# Patient Record
Sex: Female | Born: 1995 | Race: White | Hispanic: No | Marital: Married | Smoking: Current every day smoker
Health system: Southern US, Community
[De-identification: ages and names within clinical notes are randomized; demographics above are authoritative.]

## PROBLEM LIST (undated history)

## (undated) DIAGNOSIS — N809 Endometriosis, unspecified: Secondary | ICD-10-CM

## (undated) DIAGNOSIS — R51 Headache: Secondary | ICD-10-CM

## (undated) DIAGNOSIS — R519 Headache, unspecified: Secondary | ICD-10-CM

## (undated) HISTORY — PX: TYMPANOSTOMY TUBE PLACEMENT: SHX32

---
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
## 2014-02-21 ENCOUNTER — Encounter: Payer: Self-pay | Admitting: Obstetrics and Gynecology

## 2014-07-17 NOTE — H&P (Signed)
L&D Evaluation:  History:  HPI 19 yo G3P0020 [facility] 6745[facility]d [facility] presenting for decreased fetal movement.  Patient seen earlier this week on 06/15/2012 with reactive NST at that time.  She has done kick counts and gotten around 7/hr but this is still below her baseline.  She denies CTX, LOF, VB.   Presents with decreased fetal movement   Patient's Medical History No Chronic Illness   Patient's Surgical History none   Medications Pre Natal Vitamins   Allergies NKDA   Social History none   Family History Non-Contributory   ROS:  ROS All systems were reviewed.  HEENT, CNS, GI, GU, Respiratory, CV, Renal and Musculoskeletal systems were found to be normal.   Exam:  Vital Signs stable   Urine Protein not completed   General no apparent distress   Mental Status clear   Abdomen gravid, non-tender   Estimated Fetal Weight Average for gestational age   Fetal Position vtx on US   Back no CVAT   Edema no edema   FHT normal rate with no decels, category I reactive by <32 week criteria   Ucx absent   Other Viable IUP, vtx, anterior placenta, subjectively normal fluid   Impression:  Impression decreased fetal movement   Plan:  Plan EFM/NST   Comments 1) Reacitve NST with over an hour of monitoring.  The patient is able to correlate some of the onscreen movement seen but not all especially thos into anterior placenta 2) Discharge home has follow up this coming thursday with myself in [place]   Follow Up Appointment already scheduled   Electronic Signatures: Lorrene ReidStaebler, Jadyn Brasher M (MD)  (Signed 11-Apr-14 16:26)  Authored: L&D Evaluation   Last Updated: 11-Apr-14 16:26 by Lorrene ReidStaebler, Erickson Yamashiro M (MD)

## 2014-07-17 NOTE — H&P (Signed)
L&D Evaluation:  History Expanded:  HPI 19 yo G3P0020 with EDD of 09/19/2012 per 8 week US. Pt was seen today at office, membranes were stripped and pt subsequently noted fluid running down her leg. Exam at office was equivocal for rupture. +FM, some bleeding after check today. Pt reports feeling occasional contractions. [facility].   Maternal Varicella Non-Immune   Rubella Results (Maternal) immune   Presents with leaking fluid   Patient's Medical History No Chronic Illness   Patient's Surgical History none   Medications Pre Natal Vitamins   Allergies NKDA   Social History none   Family History Non-Contributory   ROS:  ROS see HPI   Exam:  Vital Signs stable   Urine Protein not completed   General no apparent distress   Mental Status clear   Abdomen gravid, non-tender   Estimated Fetal Weight Average for gestational age   Fetal Position vertex   Back no CVAT   Edema no edema   Pelvic 1/75/-2   FHT normal rate with no decels, category I, baseline 135 mod variability, + accel   Ucx irregular   Other Fern, nitrizine & pooling negative, pt rechecked after 1 hour of resting in bed, tests were negative again.   Impression:  Impression Intact membranes   Plan:  Plan discharge   Comments Discussed possible causes of fluid noted such as urinary incontinence. I feel reassured that membranes are intact as several exams failed to show +fern and I was able to palpate membranes on exam. Labor precautions reviewed.   Electronic Signatures: Vella KohlerBrothers, Brixon Zhen K (CNM)  (Signed 07-Jul-14 17:51)  Authored: L&D Evaluation   Last Updated: 07-Jul-14 17:51 by Vella KohlerBrothers, Karna Abed K (CNM)

## 2014-07-17 NOTE — H&P (Signed)
L&D Evaluation:  History:  HPI 19 yo G3P0020 with EDD of 09/19/2012 per 8 week US presents at 4739 2/[redacted] weeks gestation with c/o "contractions all day", that became more regular between 6&7PM at which time they were q5-7 minutes. States her contractions are now making her nauseous. Has had a bloody show since membranes were stripped 2 days ago. +FM.  [facility], a negative first trimester screen, a normal anatomy scan, and anemia. LABS: GBS negative. TDAP given 08/08/2012   Presents with contractions   Patient's Medical History No Chronic Illness   Patient's Surgical History none   Medications Pre Natal Vitamins  Iron   Allergies NKDA   Social History none   Family History Non-Contributory   ROS:  ROS see HPI   Exam:  Vital Signs stable  109/56   Urine Protein not completed   General grimacing with some ctxs   Mental Status clear   Chest clear   Heart normal sinus rhythm, no murmur/gallop/rubs   Abdomen gravid, tender with contractions   Estimated Fetal Weight Average for gestational age   Fetal Position vtx   Edema 1+   Reflexes 3+   Pelvic no external lesions, 3/80%/-1   Mebranes Intact   FHT normal rate with no decels, 135 with accels to 160s to 180   FHT Description mod variability   Ucx q2-3 with some couplets, triplets   Skin dry   Impression:  Impression IUP at 39 3/7 weeks in early labor   Plan:  Plan EFM/NST, monitor contractions and for cervical change, Stadol for pain, epidural when more active   Electronic Signatures: Trinna BalloonGutierrez, Elim Peale L (CNM)  (Signed 10-Jul-14 03:23)  Authored: L&D Evaluation   Last Updated: 10-Jul-14 03:23 by Trinna BalloonGutierrez, Natalya Domzalski L (CNM)

## 2014-08-27 ENCOUNTER — Encounter: Payer: Self-pay | Admitting: *Deleted

## 2014-08-27 ENCOUNTER — Encounter
Admission: RE | Admit: 2014-08-27 | Discharge: 2014-08-27 | Disposition: A | Discharge status: Home / Self Care | Payer: 59 | Source: Ambulatory Visit | Attending: Obstetrics and Gynecology | Admitting: Obstetrics and Gynecology

## 2014-08-27 DIAGNOSIS — Z975 Presence of (intrauterine) contraceptive device: Secondary | ICD-10-CM | POA: Diagnosis not present

## 2014-08-27 DIAGNOSIS — N809 Endometriosis, unspecified: Secondary | ICD-10-CM | POA: Diagnosis not present

## 2014-08-27 DIAGNOSIS — Z800 Family history of malignant neoplasm of digestive organs: Secondary | ICD-10-CM | POA: Diagnosis not present

## 2014-08-27 DIAGNOSIS — Z801 Family history of malignant neoplasm of trachea, bronchus and lung: Secondary | ICD-10-CM | POA: Diagnosis not present

## 2014-08-27 DIAGNOSIS — R102 Pelvic and perineal pain: Secondary | ICD-10-CM | POA: Diagnosis present

## 2014-08-27 DIAGNOSIS — G8929 Other chronic pain: Secondary | ICD-10-CM | POA: Diagnosis present

## 2014-08-27 LAB — COMPREHENSIVE METABOLIC PANEL
ALBUMIN: 3.9 g/dL (ref 3.5–5.0)
ALK PHOS: 80 U/L (ref 38–126)
ALT: 14 U/L (ref 14–54)
ANION GAP: 6 (ref 5–15)
AST: 16 U/L (ref 15–41)
BILIRUBIN TOTAL: 0.3 mg/dL (ref 0.3–1.2)
BUN: 13 mg/dL (ref 6–20)
CO2: 26 mmol/L (ref 22–32)
CREATININE: 0.67 mg/dL (ref 0.44–1.00)
Calcium: 9 mg/dL (ref 8.9–10.3)
Chloride: 106 mmol/L (ref 101–111)
GFR calc non Af Amer: >60 mL/min (ref >60)
Glucose, Bld: 89 mg/dL (ref 65–99)
Potassium: 4.1 mmol/L (ref 3.5–5.1)
Sodium: 138 mmol/L (ref 135–145)
TOTAL PROTEIN: 6.9 g/dL (ref 6.5–8.1)

## 2014-08-27 LAB — ABO/RH: ABO/RH(D): B POS

## 2014-08-27 LAB — TYPE AND SCREEN
ABO/RH(D): B POS
ANTIBODY SCREEN: NEGATIVE

## 2014-08-27 LAB — CBC
HEMATOCRIT: 41 % (ref 35.0–47.0)
Hemoglobin: 14.1 g/dL (ref 12.0–16.0)
MCH: 29.6 pg (ref 26.0–34.0)
MCHC: 34.3 g/dL (ref 32.0–36.0)
MCV: 86.1 fL (ref 80.0–100.0)
Platelets: 265 10*3/uL (ref 150–440)
RBC: 4.76 MIL/uL (ref 3.80–5.20)
RDW: 12.7 % (ref 11.5–14.5)
WBC: 9.5 10*3/uL (ref 3.6–11.0)

## 2014-08-27 LAB — PREGNANCY, URINE: Preg Test, Ur: NEGATIVE

## 2014-08-27 NOTE — Patient Instructions (Signed)
  Your procedure is scheduled on:6/23/16To find out your arrival time please call [phone] between 1PM - 3PM on 08/28/14.  Remember: Instructions that are not followed completely may result in serious medical risk, up to and including death, or upon the discretion of your surgeon and anesthesiologist your surgery may need to be rescheduled.    __x__ 1. Do not eat food or drink liquids after midnight. No gum chewing or hard candies.     __x__ 2. No Alcohol for 24 hours before or after surgery.   ____ 3. Bring all medications with you on the day of surgery if instructed.    _x___ 4. Notify your doctor if there is any change in your medical condition     (cold, fever, infections).     Do not wear jewelry, make-up, hairpins, clips or nail polish.  Do not wear lotions, powders, or perfumes. You may wear deodorant.  Do not shave 48 hours prior to surgery. Men may shave face and neck.  Do not bring valuables to the hospital.    [facility] is not responsible for any belongings or valuables.               Contacts, dentures or bridgework may not be worn into surgery.  Leave your suitcase in the car. After surgery it may be brought to your room.  For patients admitted to the hospital, discharge time is determined by your                treatment team.   Patients discharged the day of surgery will not be allowed to drive home.   Please read over the following fact sheets that you were given:   Surgical Site Infection Prevention   ____ Take these medicines the morning of surgery with A SIP OF WATER:    1.   2.   3.   4.  5.  6.  ____ Fleet Enema (as directed)   __x__ Use CHG Soap as directed  ____ Use inhalers on the day of surgery  ____ Stop metformin 2 days prior to surgery    ____ Take 1/2 of usual insulin dose the night before surgery and none on the morning of surgery.   ____ Stop Coumadin/Plavix/aspirin on   ____ Stop Anti-inflammatories on    ____ Stop supplements  until after surgery.    ____ Bring C-Pap to the hospital.

## 2014-08-30 ENCOUNTER — Ambulatory Visit: Payer: 59 | Admitting: Certified Registered Nurse Anesthetist

## 2014-08-30 ENCOUNTER — Encounter: Payer: Self-pay | Admitting: *Deleted

## 2014-08-30 ENCOUNTER — Ambulatory Visit
Admission: RE | Admit: 2014-08-30 | Discharge: 2014-08-30 | Disposition: A | Discharge status: Home / Self Care | Payer: 59 | Source: Ambulatory Visit | Attending: Obstetrics and Gynecology | Admitting: Obstetrics and Gynecology

## 2014-08-30 ENCOUNTER — Encounter
Admission: RE | Disposition: A | Discharge status: Home / Self Care | Payer: Self-pay | Source: Ambulatory Visit | Attending: Obstetrics and Gynecology

## 2014-08-30 DIAGNOSIS — Z975 Presence of (intrauterine) contraceptive device: Secondary | ICD-10-CM | POA: Insufficient documentation

## 2014-08-30 DIAGNOSIS — N809 Endometriosis, unspecified: Secondary | ICD-10-CM | POA: Diagnosis not present

## 2014-08-30 DIAGNOSIS — G8929 Other chronic pain: Secondary | ICD-10-CM

## 2014-08-30 DIAGNOSIS — Z801 Family history of malignant neoplasm of trachea, bronchus and lung: Secondary | ICD-10-CM | POA: Insufficient documentation

## 2014-08-30 DIAGNOSIS — Z800 Family history of malignant neoplasm of digestive organs: Secondary | ICD-10-CM | POA: Insufficient documentation

## 2014-08-30 DIAGNOSIS — R102 Pelvic and perineal pain: Secondary | ICD-10-CM | POA: Insufficient documentation

## 2014-08-30 HISTORY — DX: Headache, unspecified: R51.9

## 2014-08-30 HISTORY — PX: LAPAROSCOPY: SHX197

## 2014-08-30 HISTORY — DX: Headache: R51

## 2014-08-30 LAB — POCT PREGNANCY, URINE: PREG TEST UR: NEGATIVE

## 2014-08-30 SURGERY — LAPAROSCOPY, DIAGNOSTIC
Anesthesia: General | Wound class: Clean Contaminated

## 2014-08-30 MED ORDER — ONDANSETRON HCL 4 MG/2ML IJ SOLN
INTRAMUSCULAR | Status: DC | PRN
  Administered 2014-08-30: 4 mg via INTRAVENOUS

## 2014-08-30 MED ORDER — FENTANYL CITRATE (PF) 100 MCG/2ML IJ SOLN
INTRAMUSCULAR | Status: DC | PRN
  Administered 2014-08-30: 100 ug via INTRAVENOUS
  Administered 2014-08-30: 50 ug via INTRAVENOUS

## 2014-08-30 MED ORDER — GLYCOPYRROLATE 0.2 MG/ML IJ SOLN
INTRAMUSCULAR | Status: DC | PRN
  Administered 2014-08-30: 0.6 mg via INTRAVENOUS

## 2014-08-30 MED ORDER — ROCURONIUM BROMIDE 100 MG/10ML IV SOLN
INTRAVENOUS | Status: DC | PRN
  Administered 2014-08-30: 30 mg via INTRAVENOUS
  Administered 2014-08-30: 10 mg via INTRAVENOUS

## 2014-08-30 MED ORDER — KETOROLAC TROMETHAMINE 30 MG/ML IJ SOLN
INTRAMUSCULAR | Status: DC | PRN
  Administered 2014-08-30: 30 mg via INTRAVENOUS

## 2014-08-30 MED ORDER — HYDROCODONE-ACETAMINOPHEN 5-325 MG PO TABS: 1.0000 | ORAL_TABLET | Freq: Four times a day (QID) | ORAL | Status: DC | PRN

## 2014-08-30 MED ORDER — EPHEDRINE SULFATE 50 MG/ML IJ SOLN
INTRAMUSCULAR | Status: DC | PRN
  Administered 2014-08-30: 5 mg via INTRAVENOUS

## 2014-08-30 MED ORDER — DEXAMETHASONE SODIUM PHOSPHATE 4 MG/ML IJ SOLN
INTRAMUSCULAR | Status: DC | PRN
  Administered 2014-08-30: 5 mg via INTRAVENOUS

## 2014-08-30 MED ORDER — LIDOCAINE HCL (CARDIAC) 20 MG/ML IV SOLN
INTRAVENOUS | Status: DC | PRN
  Administered 2014-08-30: 80 mg via INTRAVENOUS

## 2014-08-30 MED ORDER — LACTATED RINGERS IV SOLN
INTRAVENOUS | Status: DC
  Administered 2014-08-30: 11:00:00 via INTRAVENOUS

## 2014-08-30 MED ORDER — FAMOTIDINE 20 MG PO TABS
20.0000 mg | ORAL_TABLET | Freq: Once | ORAL | Status: AC
  Administered 2014-08-30: 20 mg via ORAL

## 2014-08-30 MED ORDER — SILVER NITRATE-POT NITRATE 75-25 % EX MISC
CUTANEOUS | Status: DC | PRN
  Administered 2014-08-30 (×2): 2

## 2014-08-30 MED ORDER — IBUPROFEN 600 MG PO TABS: 600.0000 mg | ORAL_TABLET | Freq: Four times a day (QID) | ORAL | Status: DC | PRN

## 2014-08-30 MED ORDER — MIDAZOLAM HCL 2 MG/2ML IJ SOLN
INTRAMUSCULAR | Status: DC | PRN
  Administered 2014-08-30: 2 mg via INTRAVENOUS

## 2014-08-30 MED ORDER — PHENYLEPHRINE HCL 10 MG/ML IJ SOLN
INTRAMUSCULAR | Status: DC | PRN
  Administered 2014-08-30 (×2): 100 ug via INTRAVENOUS

## 2014-08-30 MED ORDER — NEOSTIGMINE METHYLSULFATE 10 MG/10ML IV SOLN
INTRAVENOUS | Status: DC | PRN
  Administered 2014-08-30: 4 mg via INTRAVENOUS

## 2014-08-30 MED ORDER — ONDANSETRON HCL 4 MG/2ML IJ SOLN
INTRAMUSCULAR | Status: AC
  Filled 2014-08-30: qty 2

## 2014-08-30 MED ORDER — BUPIVACAINE HCL (PF) 0.5 % IJ SOLN
INTRAMUSCULAR | Status: AC
  Filled 2014-08-30: qty 30

## 2014-08-30 MED ORDER — ONDANSETRON HCL 4 MG/2ML IJ SOLN
4.0000 mg | Freq: Once | INTRAMUSCULAR | Status: AC | PRN
  Administered 2014-08-30: 4 mg via INTRAVENOUS

## 2014-08-30 MED ORDER — FENTANYL CITRATE (PF) 100 MCG/2ML IJ SOLN: 25.0000 ug | INTRAMUSCULAR | Status: DC | PRN

## 2014-08-30 MED ORDER — BUPIVACAINE HCL 0.5 % IJ SOLN
INTRAMUSCULAR | Status: DC | PRN
  Administered 2014-08-30: 10 mL

## 2014-08-30 MED ORDER — PROPOFOL 10 MG/ML IV BOLUS
INTRAVENOUS | Status: DC | PRN
  Administered 2014-08-30: 150 mg via INTRAVENOUS

## 2014-08-30 SURGICAL SUPPLY — 40 items
BLADE SURG SZ11 CARB STEEL (BLADE) ×3 IMPLANT
CANISTER SUCT 1200ML W/VALVE (MISCELLANEOUS) ×3 IMPLANT
CHLORAPREP W/TINT 26ML (MISCELLANEOUS) ×3 IMPLANT
DRAPE LEGGINS SURG 28X43 STRL (DRAPES) ×3 IMPLANT
DRAPE SHEET LG 3/4 BI-LAMINATE (DRAPES) ×3 IMPLANT
DRAPE UNDER BUTTOCK W/FLU (DRAPES) ×3 IMPLANT
DRESSING TELFA 4X3 1S ST N-ADH (GAUZE/BANDAGES/DRESSINGS) ×3 IMPLANT
GLOVE BIO SURGEON STRL SZ7 (GLOVE) ×9 IMPLANT
GLOVE BIOGEL PI IND STRL 7.5 (GLOVE) ×3 IMPLANT
GLOVE BIOGEL PI INDICATOR 7.5 (GLOVE) ×6
GOWN STRL REUS W/ TWL LRG LVL3 (GOWN DISPOSABLE) ×3 IMPLANT
GOWN STRL REUS W/TWL LRG LVL3 (GOWN DISPOSABLE) ×6
IRRIGATION STRYKERFLOW (MISCELLANEOUS) IMPLANT
IRRIGATOR STRYKERFLOW (MISCELLANEOUS)
IV LACTATED RINGERS 1000ML (IV SOLUTION) ×3 IMPLANT
JELLY LUB 2OZ STRL (MISCELLANEOUS) ×2
JELLY LUBE 2OZ STRL (MISCELLANEOUS) ×1 IMPLANT
KIT RM TURNOVER CYSTO AR (KITS) ×3 IMPLANT
LABEL OR SOLS (LABEL) ×3 IMPLANT
LIQUID BAND (GAUZE/BANDAGES/DRESSINGS) ×3 IMPLANT
NDL SAFETY 22GX1.5 (NEEDLE) ×3 IMPLANT
NEEDLE HYPO 22GX1.5 SAFETY (NEEDLE) ×3 IMPLANT
NS IRRIG 500ML POUR BTL (IV SOLUTION) ×3 IMPLANT
PACK LAP CHOLECYSTECTOMY (MISCELLANEOUS) ×3 IMPLANT
PAD GROUND ADULT SPLIT (MISCELLANEOUS) ×3 IMPLANT
PAD OB MATERNITY 4.3X12.25 (PERSONAL CARE ITEMS) ×3 IMPLANT
PAD PREP 24X41 OB/GYN DISP (PERSONAL CARE ITEMS) ×3 IMPLANT
SCISSORS METZENBAUM CVD 33 (INSTRUMENTS) IMPLANT
SHEARS HARMONIC ACE PLUS 36CM (ENDOMECHANICALS) ×3 IMPLANT
SLEEVE ENDOPATH XCEL 5M (ENDOMECHANICALS) ×6 IMPLANT
SOL PREP PVP 2OZ (MISCELLANEOUS) ×3
SOLUTION PREP PVP 2OZ (MISCELLANEOUS) ×1 IMPLANT
SUT MNCRL 4-0 (SUTURE) ×2
SUT MNCRL 4-0 27XMFL (SUTURE) ×1
SUT VIC AB 2-0 UR6 27 (SUTURE) IMPLANT
SUTURE MNCRL 4-0 27XMF (SUTURE) ×1 IMPLANT
TROCAR ENDO BLADELESS 11MM (ENDOMECHANICALS) ×3 IMPLANT
TROCAR XCEL NON-BLD 5MMX100MML (ENDOMECHANICALS) ×3 IMPLANT
TROCAR XCEL UNIV SLVE 11M 100M (ENDOMECHANICALS) ×6 IMPLANT
TUBING INSUFFLATOR HI FLOW (MISCELLANEOUS) ×3 IMPLANT

## 2014-08-30 NOTE — Op Note (Signed)
Operative Report   Pre-Op Diagnosis: Chronic pelvic pain  Post-Op Diagnosis: Endometriosis  Procedures: Diagnostic laparoscopy  Primary Surgeon: Dr. Thomasene Mohair   EBL: Minimal   IVF: 800 mL   Urine output: 200 mL  Specimens: None  Drains: None  Complications: None   Disposition: PACU   Condition: Stable   Findings:  1) Normal-appearing uterus fallopian tubes and ovaries bilaterally. 2) Area concerning for endometriosis and left ovarian fossa directly overlying ureter.  Procedure Summary:  The patient was taken to the operating room where general anesthesia was administered and found to be adequate. The patient was placed in the dorsal supine lithotomy position in Moccasin stirrups and prepped and draped in the usual sterile fashion. After a timeout was called, a sterile speculum was placed the vagina and a single-tooth tenaculum was affixed to the anterior lip of the cervix. An acorn uterine manipulator was attached to the tenaculum. An indwelling catheter was placed in her bladder.  Attention was turned to the abdomen where after injection of local aesthetic, a 5 mm infraumbilical incision was made with the scalpel. Entrance to the abdomen was obtained via Optiview trocar direct visualization entry.  Verification of entry into the abdominal cavity was undertaken using opening pressures. After verification, the abdomen was insufflated with CO2. The camera was inserted through the umbilical port with the above-noted findings. Atraumatic entry was also verified. A 5 mm suprapubic port was placed under direct intra-abdominal camera visualization. Decision was made not to biopsy lesion due to location and proximity to the ureter. The appendix was partially visualized and appeared normal. The right upper quadrant was also visualized and appeared normal. This terminated the procedure. All instrumentation was removed from the abdomen and the abdomen was desufflated. All trochars were  removed. The skin was reapproximated using Dermabond. Additional local epistatic was injected into each incision site for a total of approximately 18 mL's of half percent Marcaine plain.  The acorn manipulator was removed and the tenaculum was removed from the cervix. Hemostasis was obtained using silver nitrate. The catheter was removed from her bladder. The vagina was inspected and no instruments or sponges were left behind.  Sponge, lap, needle, and instrument counts were correct x 2.  VTE prophylaxis: SCDs. Antibiotic prophylaxis: none.The patient was then awakened and taken to the recovery room in stable condition.   Conard Novak, MD, FACOG 08/30/2014 12:34 PM

## 2014-08-30 NOTE — Transfer of Care (Signed)
Immediate Anesthesia Transfer of Care Note  Patient: Dorothy Nguyen  Procedure(s) Performed: Procedure(s): LAPAROSCOPY DIAGNOSTIC (N/A)  Patient Location: PACU  Anesthesia Type:General  Level of Consciousness: awake, alert  and oriented  Airway & Oxygen Therapy: Patient Spontanous Breathing and Patient connected to face mask oxygen  Post-op Assessment: Report given to RN and Post -op Vital signs reviewed and stable  Post vital signs: Reviewed and stable  Last Vitals:  Filed Vitals:   08/30/14 1238  BP: 106/78  Pulse: 103  Temp: 36.8 C  Resp: 23    Complications: No apparent anesthesia complications

## 2014-08-30 NOTE — Anesthesia Postprocedure Evaluation (Signed)
  Anesthesia Post-op Note  Patient: Dorothy Nguyen  Procedure(s) Performed: Procedure(s): LAPAROSCOPY DIAGNOSTIC (N/A)  Anesthesia type:General  Patient location: PACU  Post pain: Pain level controlled  Post assessment: Post-op Vital signs reviewed, Patient's Cardiovascular Status Stable, Respiratory Function Stable, Patent Airway and No signs of Nausea or vomiting  Post vital signs: Reviewed and stable  Last Vitals:  Filed Vitals:   08/30/14 1330  BP: 105/56  Pulse: 70  Temp: 36.1 C  Resp: 16    Level of consciousness: awake, alert  and patient cooperative  Complications: No apparent anesthesia complications

## 2014-08-30 NOTE — Anesthesia Preprocedure Evaluation (Signed)
Anesthesia Evaluation  Patient identified by MRN, date of birth, ID band Patient awake    Reviewed: Allergy & Precautions, NPO status , Patient's Chart, lab work & pertinent test results  History of Anesthesia Complications Negative for: history of anesthetic complications  Airway Mallampati: II  TM Distance: >3 FB Neck ROM: Full    Dental  (+) Teeth Intact   Pulmonary Current Smoker (1/2 ppd),          Cardiovascular     Neuro/Psych    GI/Hepatic GERD- (1-2 x /week)  ,  Endo/Other    Renal/GU      Musculoskeletal   Abdominal   Peds  Hematology   Anesthesia Other Findings   Reproductive/Obstetrics                             Anesthesia Physical Anesthesia Plan  ASA: II  Anesthesia Plan: General   Post-op Pain Management:    Induction:   Airway Management Planned: Oral ETT  Additional Equipment:   Intra-op Plan:   Post-operative Plan:   Informed Consent: I have reviewed the patients History and Physical, chart, labs and discussed the procedure including the risks, benefits and alternatives for the proposed anesthesia with the patient or authorized representative who has indicated his/her understanding and acceptance.     Plan Discussed with:   Anesthesia Plan Comments:         Anesthesia Quick Evaluation

## 2014-08-30 NOTE — OR Nursing (Signed)
After getting dressed for dc home.  She felt a slight wave of nausea.  Explained use of aroma therapy (peppermint oil) on a cotton ball for nausea.  Safety assessment complete.

## 2014-08-30 NOTE — OR Nursing (Signed)
The aromatherapy is pleasant.  I feel better.

## 2014-08-30 NOTE — Discharge Instructions (Addendum)
AMBULATORY SURGERY  °DISCHARGE INSTRUCTIONS ° ° °1) The drugs that you were given will stay in your system until tomorrow so for the next 24 hours you should not: ° °A) Drive an automobile °B) Make any legal decisions °C) Drink any alcoholic beverage ° ° °2) You may resume regular meals tomorrow.  Today it is better to start with liquids and gradually work up to solid foods. ° °You may eat anything you prefer, but it is better to start with liquids, then soup and crackers, and gradually work up to solid foods. ° ° °3) Please notify your doctor immediately if you have any unusual bleeding, trouble breathing, redness and pain at the surgery site, drainage, fever, or pain not relieved by medication. ° ° °4) Additional Instructions: °5)  °

## 2014-08-30 NOTE — H&P (Signed)
History and Physical Interval Note:  Dorothy Nguyen  has presented today for surgery, with the diagnosis of chronic pelvic pain  The various methods of treatment have been discussed with the patient and family. After consideration of risks, benefits and other options for treatment, the patient has consented to  Procedure(s): LAPAROSCOPY DIAGNOSTIC (N/A) as a surgical intervention .  The patient's history has been reviewed, patient examined, no change in status, stable for surgery.  I have reviewed the patient's chart and labs.  Questions were answered to the patient's satisfaction.    The patient is not taking a beta blocker and none is indicated for this surgery based on the patient's risk factors.   Conard Novak, MD, FACOG 08/30/2014 11:19 AM

## 2014-08-30 NOTE — Anesthesia Procedure Notes (Signed)
Procedure Name: Intubation Date/Time: 08/30/2014 11:31 AM Performed by: Omer Jack Pre-anesthesia Checklist: Patient identified, Suction available, Emergency Drugs available, Patient being monitored and Timeout performed Patient Re-evaluated:Patient Re-evaluated prior to inductionOxygen Delivery Method: Circle system utilized Preoxygenation: Pre-oxygenation with 100% oxygen Intubation Type: IV induction Ventilation: Mask ventilation without difficulty Laryngoscope Size: Mac and 3 Grade View: Grade I Tube type: Oral Tube size: 7.0 mm Number of attempts: 1 Airway Equipment and Method: Stylet Placement Confirmation: ETT inserted through vocal cords under direct vision,  positive ETCO2 and breath sounds checked- equal and bilateral Secured at: 22 cm Tube secured with: Tape Dental Injury: Teeth and Oropharynx as per pre-operative assessment

## 2017-04-17 ENCOUNTER — Emergency Department: Payer: BLUE CROSS/BLUE SHIELD

## 2017-04-17 ENCOUNTER — Emergency Department
Admission: EM | Admit: 2017-04-17 | Discharge: 2017-04-18 | Disposition: A | Discharge status: Home / Self Care | Payer: BLUE CROSS/BLUE SHIELD | Attending: Emergency Medicine | Admitting: Emergency Medicine

## 2017-04-17 ENCOUNTER — Other Ambulatory Visit: Payer: Self-pay

## 2017-04-17 DIAGNOSIS — F17210 Nicotine dependence, cigarettes, uncomplicated: Secondary | ICD-10-CM | POA: Diagnosis not present

## 2017-04-17 DIAGNOSIS — N83291 Other ovarian cyst, right side: Secondary | ICD-10-CM | POA: Insufficient documentation

## 2017-04-17 DIAGNOSIS — R102 Pelvic and perineal pain: Secondary | ICD-10-CM | POA: Diagnosis present

## 2017-04-17 DIAGNOSIS — R52 Pain, unspecified: Secondary | ICD-10-CM

## 2017-04-17 DIAGNOSIS — N83201 Unspecified ovarian cyst, right side: Secondary | ICD-10-CM

## 2017-04-17 HISTORY — DX: Endometriosis, unspecified: N80.9

## 2017-04-17 LAB — COMPREHENSIVE METABOLIC PANEL
ALT: 12 U/L — ABNORMAL LOW (ref 14–54)
AST: 14 U/L — ABNORMAL LOW (ref 15–41)
Albumin: 4.3 g/dL (ref 3.5–5.0)
Alkaline Phosphatase: 72 U/L (ref 38–126)
Anion gap: 10 (ref 5–15)
BUN: 15 mg/dL (ref 6–20)
CO2: 27 mmol/L (ref 22–32)
Calcium: 9.3 mg/dL (ref 8.9–10.3)
Chloride: 102 mmol/L (ref 101–111)
Creatinine, Ser: 0.77 mg/dL (ref 0.44–1.00)
GFR calc Af Amer: >60 mL/min (ref >60)
GFR calc non Af Amer: >60 mL/min (ref >60)
Glucose, Bld: 91 mg/dL (ref 65–99)
Potassium: 3.5 mmol/L (ref 3.5–5.1)
SODIUM: 139 mmol/L (ref 135–145)
Total Bilirubin: 0.5 mg/dL (ref 0.3–1.2)
Total Protein: 7.3 g/dL (ref 6.5–8.1)

## 2017-04-17 LAB — POCT PREGNANCY, URINE: Preg Test, Ur: NEGATIVE

## 2017-04-17 LAB — CBC
HCT: 39.5 % (ref 35.0–47.0)
Hemoglobin: 13.6 g/dL (ref 12.0–16.0)
MCH: 28.9 pg (ref 26.0–34.0)
MCHC: 34.3 g/dL (ref 32.0–36.0)
MCV: 84.1 fL (ref 80.0–100.0)
PLATELETS: 305 10*3/uL (ref 150–440)
RBC: 4.7 MIL/uL (ref 3.80–5.20)
RDW: 13.3 % (ref 11.5–14.5)
WBC: 10.4 10*3/uL (ref 3.6–11.0)

## 2017-04-17 LAB — URINALYSIS, COMPLETE (UACMP) WITH MICROSCOPIC
Bilirubin Urine: NEGATIVE
Glucose, UA: NEGATIVE mg/dL
Hgb urine dipstick: NEGATIVE
Ketones, ur: NEGATIVE mg/dL
Leukocytes, UA: NEGATIVE
Nitrite: NEGATIVE
PH: 7 (ref 5.0–8.0)
Protein, ur: NEGATIVE mg/dL
Specific Gravity, Urine: 1.023 (ref 1.005–1.030)

## 2017-04-17 LAB — WET PREP, GENITAL
Clue Cells Wet Prep HPF POC: NONE SEEN
Sperm: NONE SEEN
Trich, Wet Prep: NONE SEEN
Yeast Wet Prep HPF POC: NONE SEEN

## 2017-04-17 LAB — LIPASE, BLOOD: Lipase: 34 U/L (ref 11–51)

## 2017-04-17 MED ORDER — FENTANYL CITRATE (PF) 100 MCG/2ML IJ SOLN
25.0000 ug | Freq: Once | INTRAMUSCULAR | Status: AC
  Administered 2017-04-17: 25 ug via INTRAVENOUS
  Filled 2017-04-17: qty 2

## 2017-04-17 MED ORDER — ONDANSETRON HCL 4 MG/2ML IJ SOLN
4.0000 mg | Freq: Once | INTRAMUSCULAR | Status: AC
  Administered 2017-04-17: 4 mg via INTRAVENOUS
  Filled 2017-04-17: qty 2

## 2017-04-17 MED ORDER — SODIUM CHLORIDE 0.9 % IV BOLUS (SEPSIS)
500.0000 mL | Freq: Once | INTRAVENOUS | Status: AC
  Administered 2017-04-17: 500 mL via INTRAVENOUS

## 2017-04-17 NOTE — ED Notes (Signed)
Pt reports RLQ pain x 1 day, reports has had similar pain in past and it was ruptured ovarian cyst.  Pt reports cloudy urine w/ strong odor, but not malodorous.

## 2017-04-17 NOTE — ED Provider Notes (Signed)
[facility] Emergency Department Provider Note   ____________________________________________   First MD Initiated Contact with Patient 04/17/17 2306     (approximate)  I have reviewed the triage vital signs and the nursing notes.   HISTORY  Chief Complaint Abdominal Pain and Back Pain    HPI Dorothy Nguyen is a 22 y.o. female who presents to the ED from home with a chief complaint of pelvic pain, back pain, dysuria and dark, cloudy urine.  Patient reports onset of lower abdominal pain this morning.  Over the course of the day pain has radiated to her right lower quadrant/pelvis, and now into bilateral lower back.  Symptoms associated with occasional nausea, dysuria and dark, cloudy urine.  Patient has a history of chronic pelvic pain and states this feels like an ovarian cyst which she has had previously.  Denies associated fever, chills, chest pain, shortness of breath, vomiting, diarrhea.  Notes vaginal discharge.  Denies vaginal bleeding.  Last sexual intercourse over 1 week ago.  Denies recent travel or trauma.   Past Medical History:  Diagnosis Date  . Endometriosis   . Headache     Patient Active Problem List   Diagnosis Date Noted  . Chronic pelvic pain in female 08/30/2014    Past Surgical History:  Procedure Laterality Date  . LAPAROSCOPY N/A 08/30/2014   Procedure: LAPAROSCOPY DIAGNOSTIC;  Surgeon: Conard Novak, MD;  Location: [facility] ORS;  Service: Gynecology;  Laterality: N/A;  . TYMPANOSTOMY TUBE PLACEMENT     2 times    Prior to Admission medications   Medication Sig Start Date End Date Taking? Authorizing Provider  HYDROcodone-acetaminophen (NORCO) 5-325 MG per tablet Take 1 tablet by mouth every 6 (six) hours as needed for moderate pain. 08/30/14   Conard Novak, MD  ibuprofen (ADVIL,MOTRIN) 600 MG tablet Take 1 tablet (600 mg total) by mouth every 6 (six) hours as needed for mild pain or cramping. 08/30/14   Conard Novak, MD    Allergies Patient has no known allergies.  History reviewed. No pertinent family history.  Social History Social History   Tobacco Use  . Smoking status: Current Every Day Smoker    Packs/day: 0.50    Types: Cigarettes  . Smokeless tobacco: Never Used  Substance Use Topics  . Alcohol use: Yes    Comment: occ  . Drug use: No    Review of Systems  Constitutional: No fever/chills. Eyes: No visual changes. ENT: No sore throat. Cardiovascular: Denies chest pain. Respiratory: Denies shortness of breath. Gastrointestinal: For pelvic pain.  No abdominal pain.  Positive for nausea, no vomiting.  No diarrhea.  No constipation. Genitourinary: Positive for vaginal discharge and dysuria. Musculoskeletal: Positive for back pain. Skin: Negative for rash. Neurological: Negative for headaches, focal weakness or numbness.   ____________________________________________   PHYSICAL EXAM:  VITAL SIGNS: ED Triage Vitals  Enc Vitals Group     BP 04/17/17 1844 110/67     Pulse Rate 04/17/17 1844 (!) 116     Resp 04/17/17 1844 18     Temp 04/17/17 1844 98 F (36.7 C)     Temp Source 04/17/17 1844 Oral     SpO2 04/17/17 1844 100 %     Weight 04/17/17 1845 152 lb (68.9 kg)     Height 04/17/17 1845 5\' 4"  (1.626 m)     Head Circumference --      Peak Flow --      Pain Score 04/17/17 1845  8     Pain Loc --      Pain Edu? --      Excl. in GC? --     Constitutional: Alert and oriented. Well appearing and in no acute distress. Eyes: Conjunctivae are normal. PERRL. EOMI. Head: Atraumatic. Nose: No congestion/rhinnorhea. Mouth/Throat: Mucous membranes are moist.  Oropharynx non-erythematous. Neck: No stridor.   Cardiovascular: Normal rate, regular rhythm. Grossly normal heart sounds.  Good peripheral circulation. Respiratory: Normal respiratory effort.  No retractions. Lungs CTAB. Gastrointestinal: Soft and mildly tender to palpation bilateral pelvis and right upper  quadrant without rebound or guarding.  No pain at McBurney's point.  No distention. No abdominal bruits. No CVA tenderness. Musculoskeletal: No lower extremity tenderness nor edema.  No joint effusions. Neurologic:  Normal speech and language. No gross focal neurologic deficits are appreciated. No gait instability. Skin:  Skin is warm, dry and intact. No rash noted. Psychiatric: Mood and affect are normal. Speech and behavior are normal.  ____________________________________________   LABS (all labs ordered are listed, but only abnormal results are displayed)  Labs Reviewed  WET PREP, GENITAL - Abnormal; Notable for the following components:      Result Value   WBC, Wet Prep HPF POC FEW (*)    All other components within normal limits  COMPREHENSIVE METABOLIC PANEL - Abnormal; Notable for the following components:   AST 14 (*)    ALT 12 (*)    All other components within normal limits  URINALYSIS, COMPLETE (UACMP) WITH MICROSCOPIC - Abnormal; Notable for the following components:   Color, Urine YELLOW (*)    APPearance TURBID (*)    Squamous Epithelial / LPF 0-5 (*)    Bacteria, UA FEW (*)    All other components within normal limits  CHLAMYDIA/NGC RT PCR ([facility] ONLY)  LIPASE, BLOOD  CBC  POC URINE PREG, ED  POCT PREGNANCY, URINE   ____________________________________________  EKG  None ____________________________________________  RADIOLOGY  ED MD interpretation: Right ovarian cyst  Official radiology report(s): US Pelvis Transvanginal Non-ob (tv Only)  Result Date: 04/18/2017 CLINICAL DATA:  Right lower quadrant pain EXAM: TRANSABDOMINAL AND TRANSVAGINAL ULTRASOUND OF PELVIS DOPPLER ULTRASOUND OF OVARIES TECHNIQUE: Both transabdominal and transvaginal ultrasound examinations of the pelvis were performed. Transabdominal technique was performed for global imaging of the pelvis including uterus, ovaries, adnexal regions, and pelvic cul-de-sac. It was necessary to  proceed with endovaginal exam following the transabdominal exam to visualize the uterus endometrium and ovaries. Color and duplex Doppler ultrasound was utilized to evaluate blood flow to the ovaries. COMPARISON:  Pelvic ultrasound 10/13/2011 FINDINGS: Uterus Measurements: 8.9 x 5 x 5.2 cm. No fibroids or other mass visualized. Endometrium Thickness: 7.1 mm.  Intrauterine device present. Right ovary Measurements: 5 x 2.7 x 3.6 cm. Heterogenous isoechoic area with internal fluid echogenicity measuring 3.2 x 2.3 x 3.2 cm. Left ovary Measurements: 3.9 x 2.2 x 2.7 cm. Normal appearance/no adnexal mass. Pulsed Doppler evaluation of both ovaries demonstrates normal low-resistance arterial and venous waveforms. Other findings No abnormal free fluid. IMPRESSION: 1. Negative for ovarian torsion 2. Heterogenous isoechoic area with internal fluid echogenicity in the right ovary measuring 3.2 cm, possibly representing an involuting cyst; suggest 6-12 week sonographic follow-up to ensure resolution. Electronically Signed   By: Jasmine Pang M.D.   On: 04/18/2017 00:53   US Pelvis Complete  Result Date: 04/18/2017 CLINICAL DATA:  Right lower quadrant pain EXAM: TRANSABDOMINAL AND TRANSVAGINAL ULTRASOUND OF PELVIS DOPPLER ULTRASOUND OF OVARIES TECHNIQUE: Both transabdominal and transvaginal  ultrasound examinations of the pelvis were performed. Transabdominal technique was performed for global imaging of the pelvis including uterus, ovaries, adnexal regions, and pelvic cul-de-sac. It was necessary to proceed with endovaginal exam following the transabdominal exam to visualize the uterus endometrium and ovaries. Color and duplex Doppler ultrasound was utilized to evaluate blood flow to the ovaries. COMPARISON:  Pelvic ultrasound 10/13/2011 FINDINGS: Uterus Measurements: 8.9 x 5 x 5.2 cm. No fibroids or other mass visualized. Endometrium Thickness: 7.1 mm.  Intrauterine device present. Right ovary Measurements: 5 x 2.7 x 3.6 cm.  Heterogenous isoechoic area with internal fluid echogenicity measuring 3.2 x 2.3 x 3.2 cm. Left ovary Measurements: 3.9 x 2.2 x 2.7 cm. Normal appearance/no adnexal mass. Pulsed Doppler evaluation of both ovaries demonstrates normal low-resistance arterial and venous waveforms. Other findings No abnormal free fluid. IMPRESSION: 1. Negative for ovarian torsion 2. Heterogenous isoechoic area with internal fluid echogenicity in the right ovary measuring 3.2 cm, possibly representing an involuting cyst; suggest 6-12 week sonographic follow-up to ensure resolution. Electronically Signed   By: Jasmine PangKim  Fujinaga M.D.   On: 04/18/2017 00:53   [place] Pelvic Doppler (torsion R/o Or Mass Arterial Flow)  Result Date: 04/18/2017 CLINICAL DATA:  Right lower quadrant pain EXAM: TRANSABDOMINAL AND TRANSVAGINAL ULTRASOUND OF PELVIS DOPPLER ULTRASOUND OF OVARIES TECHNIQUE: Both transabdominal and transvaginal ultrasound examinations of the pelvis were performed. Transabdominal technique was performed for global imaging of the pelvis including uterus, ovaries, adnexal regions, and pelvic cul-de-sac. It was necessary to proceed with endovaginal exam following the transabdominal exam to visualize the uterus endometrium and ovaries. Color and duplex Doppler ultrasound was utilized to evaluate blood flow to the ovaries. COMPARISON:  Pelvic ultrasound 10/13/2011 FINDINGS: Uterus Measurements: 8.9 x 5 x 5.2 cm. No fibroids or other mass visualized. Endometrium Thickness: 7.1 mm.  Intrauterine device present. Right ovary Measurements: 5 x 2.7 x 3.6 cm. Heterogenous isoechoic area with internal fluid echogenicity measuring 3.2 x 2.3 x 3.2 cm. Left ovary Measurements: 3.9 x 2.2 x 2.7 cm. Normal appearance/no adnexal mass. Pulsed Doppler evaluation of both ovaries demonstrates normal low-resistance arterial and venous waveforms. Other findings No abnormal free fluid. IMPRESSION: 1. Negative for ovarian torsion 2. Heterogenous isoechoic area with  internal fluid echogenicity in the right ovary measuring 3.2 cm, possibly representing an involuting cyst; suggest 6-12 week sonographic follow-up to ensure resolution. Electronically Signed   By: Jasmine PangKim  Fujinaga M.D.   On: 04/18/2017 00:53    ____________________________________________   PROCEDURES  Procedure(s) performed:   Pelvic exam: External exam WNL without rashes, lesions or vesicles.  Speculum exam reveals scant white discharge which is not malodorous.  Bimanual exam with mild cervical tenderness.  Cervical os is not erythematous.  Procedures  Critical Care performed: No  ____________________________________________   INITIAL IMPRESSION / ASSESSMENT AND PLAN / ED COURSE  As part of my medical decision making, I reviewed the following data within the electronic MEDICAL RECORD NUMBER History obtained from family, Nursing notes reviewed and incorporated, Labs reviewed, Old chart reviewed, Radiograph reviewed  and Notes from prior ED visits.   22 year old female with chronic pelvic pain, endometriosis, history of ovarian cysts who presents with pelvic pain, back pain, dysuria and dark cloudy urine. Differential diagnosis includes, but is not limited to, ovarian cyst, ovarian torsion, acute appendicitis, diverticulitis, urinary tract infection/pyelonephritis, endometriosis, bowel obstruction, colitis, renal colic, gastroenteritis, hernia, fibroids, endometriosis, pregnancy related pain including ectopic pregnancy, etc.  Laboratory and urinalysis results unremarkable.  Will administer analgesia with antiemetic, and proceed  to pelvic ultrasound.  Clinical Course as of Apr 19 139  Wynelle Link Apr 18, 2017  0140 Patient resting in no acute distress.  Pain improved.  Updated patient and family member of ultrasound results.  Will discharge home with analgesics and patient will follow-up with her gynecologist next week.  Strict return precautions given.  Both verbalize understanding and agree with  plan of care.  [JS]    Clinical Course User Index [JS] Irean Hong, MD     ____________________________________________   FINAL CLINICAL IMPRESSION(S) / ED DIAGNOSES  Final diagnoses:  Pain  Pelvic pain in female  Cyst of right ovary     ED Discharge Orders    None       Note:  This document was prepared using Dragon voice recognition software and may include unintentional dictation errors.    Irean Hong, MD 04/18/17 [phone]

## 2017-04-17 NOTE — ED Notes (Signed)
Pt taken to US

## 2017-04-17 NOTE — ED Triage Notes (Signed)
Pt arrived via POV c/o low abdominal pain with some radiation to the RLQ, now having back pain, some dysuria and dark, cloudy urine

## 2017-04-18 LAB — CHLAMYDIA/NGC RT PCR (ARMC ONLY)
Chlamydia Tr: NOT DETECTED
N GONORRHOEAE: NOT DETECTED

## 2017-04-18 MED ORDER — OXYCODONE-ACETAMINOPHEN 5-325 MG PO TABS: 1.0000 | ORAL_TABLET | ORAL | 0 refills | Status: DC | PRN

## 2017-04-18 MED ORDER — IBUPROFEN 600 MG PO TABS: 600.0000 mg | ORAL_TABLET | Freq: Three times a day (TID) | ORAL | 0 refills | Status: DC | PRN

## 2017-04-18 MED ORDER — ONDANSETRON 4 MG PO TBDP: 4.0000 mg | ORAL_TABLET | Freq: Three times a day (TID) | ORAL | 0 refills | Status: DC | PRN

## 2017-04-18 NOTE — Discharge Instructions (Signed)
1.  You may take pain medicines as needed (Motrin/Percocet). °2.  Return to the ER for worsening symptoms, persistent vomiting, difficulty breathing or other concerns. °

## 2018-02-21 ENCOUNTER — Other Ambulatory Visit: Payer: Self-pay

## 2018-02-21 ENCOUNTER — Encounter: Payer: Self-pay | Admitting: Emergency Medicine

## 2018-02-21 ENCOUNTER — Ambulatory Visit
Admission: EM | Admit: 2018-02-21 | Discharge: 2018-02-21 | Disposition: A | Discharge status: Home / Self Care | Payer: BLUE CROSS/BLUE SHIELD | Attending: Family Medicine | Admitting: Family Medicine

## 2018-02-21 DIAGNOSIS — H6503 Acute serous otitis media, bilateral: Secondary | ICD-10-CM | POA: Insufficient documentation

## 2018-02-21 MED ORDER — AMOXICILLIN 875 MG PO TABS: 875.0000 mg | ORAL_TABLET | Freq: Two times a day (BID) | ORAL | 0 refills | Status: DC

## 2018-02-21 MED ORDER — FLUCONAZOLE 150 MG PO TABS: 150.0000 mg | ORAL_TABLET | Freq: Every day | ORAL | 1 refills | Status: DC

## 2018-02-21 NOTE — ED Triage Notes (Signed)
Pt c/o pain in the back of her ears and hearing is "muffled" she is also congested. Started about 3-4 days ago.

## 2018-02-21 NOTE — Discharge Instructions (Signed)
Over the counter flonase Sudafed

## 2018-02-21 NOTE — ED Provider Notes (Signed)
[facility]    CSN: [id number] Arrival date & time: 02/21/18  1334     History   Chief Complaint Chief Complaint  Patient presents with  . Otalgia    appt bilateral     HPI Dorothy Nguyen is a 22 y.o. female.   The history is provided by the patient.  Otalgia  Location:  Bilateral Behind ear:  No abnormality Quality:  Aching Severity:  Moderate Onset quality:  Sudden Duration:  3 days Timing:  Constant Progression:  Unchanged Chronicity:  New Context: recent URI   Context: not direct blow and not elevation change   Relieved by:  None tried Ineffective treatments:  None tried Associated symptoms: congestion   Associated symptoms: no abdominal pain, no cough, no diarrhea, no ear discharge, no fever, no headaches, no hearing loss, no neck pain, no rash, no rhinorrhea, no sore throat, no tinnitus and no vomiting     Past Medical History:  Diagnosis Date  . Endometriosis   . Headache     Patient Active Problem List   Diagnosis Date Noted  . Chronic pelvic pain in female 08/30/2014    Past Surgical History:  Procedure Laterality Date  . LAPAROSCOPY N/A 08/30/2014   Procedure: LAPAROSCOPY DIAGNOSTIC;  Surgeon: Conard Novak, MD;  Location: [facility] ORS;  Service: Gynecology;  Laterality: N/A;  . TYMPANOSTOMY TUBE PLACEMENT     2 times    OB History   No obstetric history on file.      Home Medications    Prior to Admission medications   Medication Sig Start Date End Date Taking? Authorizing Provider  amoxicillin (AMOXIL) 875 MG tablet Take 1 tablet (875 mg total) by mouth 2 (two) times daily. 02/21/18   Payton Mccallum, MD  fluconazole (DIFLUCAN) 150 MG tablet Take 1 tablet (150 mg total) by mouth daily. 02/21/18   Payton Mccallum, MD  HYDROcodone-acetaminophen (NORCO) 5-325 MG per tablet Take 1 tablet by mouth every 6 (six) hours as needed for moderate pain. 08/30/14   Conard Novak, MD  ibuprofen (ADVIL,MOTRIN) 600 MG tablet Take 1  tablet (600 mg total) by mouth every 8 (eight) hours as needed. 04/18/17   Irean Hong, MD  ondansetron (ZOFRAN ODT) 4 MG disintegrating tablet Take 1 tablet (4 mg total) by mouth every 8 (eight) hours as needed for nausea or vomiting. 04/18/17   Irean Hong, MD  oxyCODONE-acetaminophen (PERCOCET/ROXICET) 5-325 MG tablet Take 1 tablet by mouth every 4 (four) hours as needed for severe pain. 04/18/17   Irean Hong, MD    Family History Family History  Problem Relation Age of Onset  . Healthy Mother   . Healthy Father     Social History Social History   Tobacco Use  . Smoking status: Current Every Day Smoker    Packs/day: 0.25    Types: Cigarettes  . Smokeless tobacco: Never Used  Substance Use Topics  . Alcohol use: Yes    Comment: occ  . Drug use: No     Allergies   Patient has no known allergies.   Review of Systems Review of Systems  Constitutional: Negative for fever.  HENT: Positive for congestion and ear pain. Negative for ear discharge, hearing loss, rhinorrhea, sore throat and tinnitus.   Respiratory: Negative for cough.   Gastrointestinal: Negative for abdominal pain, diarrhea and vomiting.  Musculoskeletal: Negative for neck pain.  Skin: Negative for rash.  Neurological: Negative for headaches.     Physical Exam  Triage Vital Signs ED Triage Vitals  Enc Vitals Group     BP 02/21/18 1354 107/68     Pulse Rate 02/21/18 1354 88     Resp 02/21/18 1354 18     Temp 02/21/18 1354 98.1 F (36.7 C)     Temp Source 02/21/18 1354 Oral     SpO2 02/21/18 1354 100 %     Weight 02/21/18 1350 170 lb (77.1 kg)     Height 02/21/18 1350 5\' 5"  (1.651 m)     Head Circumference --      Peak Flow --      Pain Score 02/21/18 1349 4     Pain Loc --      Pain Edu? --      Excl. in GC? --    No data found.  Updated Vital Signs BP 107/68 (BP Location: Left Arm)   Pulse 88   Temp 98.1 F (36.7 C) (Oral)   Resp 18   Ht 5\' 5"  (1.651 m)   Wt 77.1 kg   LMP 01/31/2018    SpO2 100%   BMI 28.29 kg/m   Visual Acuity Right Eye Distance:   Left Eye Distance:   Bilateral Distance:    Right Eye Near:   Left Eye Near:    Bilateral Near:     Physical Exam Vitals signs and nursing note reviewed.  Constitutional:      General: She is not in acute distress.    Appearance: She is well-developed. She is not diaphoretic.  HENT:     Head: Normocephalic and atraumatic.     Right Ear: Ear canal and external ear normal. A middle ear effusion is present. Tympanic membrane is erythematous and bulging.     Left Ear: Ear canal and external ear normal. A middle ear effusion is present. Tympanic membrane is erythematous and bulging.     Mouth/Throat:     Pharynx: Uvula midline. No oropharyngeal exudate.  Eyes:     General: No scleral icterus.       Right eye: No discharge.        Left eye: No discharge.     Conjunctiva/sclera: Conjunctivae normal.     Pupils: Pupils are equal, round, and reactive to light.  Neck:     Musculoskeletal: Normal range of motion and neck supple.     Thyroid: No thyromegaly.  Cardiovascular:     Rate and Rhythm: Normal rate and regular rhythm.     Heart sounds: Normal heart sounds.  Pulmonary:     Effort: Pulmonary effort is normal. No respiratory distress.     Breath sounds: Normal breath sounds. No wheezing or rales.  Lymphadenopathy:     Cervical: No cervical adenopathy.      UC Treatments / Results  Labs (all labs ordered are listed, but only abnormal results are displayed) Labs Reviewed - No data to display  EKG None  Radiology No results found.  Procedures Procedures (including critical care time)  Medications Ordered in UC Medications - No data to display  Initial Impression / Assessment and Plan / UC Course  I have reviewed the triage vital signs and the nursing notes.  Pertinent labs & imaging results that were available during my care of the patient were reviewed by me and considered in my medical  decision making (see chart for details).      Final Clinical Impressions(s) / UC Diagnoses   Final diagnoses:  Bilateral acute serous otitis media, recurrence not specified  Discharge Instructions     Over the counter flonase Sudafed    ED Prescriptions    Medication Sig Dispense Auth. Provider   amoxicillin (AMOXIL) 875 MG tablet Take 1 tablet (875 mg total) by mouth 2 (two) times daily. 20 tablet Payton Mccallum, MD   fluconazole (DIFLUCAN) 150 MG tablet Take 1 tablet (150 mg total) by mouth daily. 1 tablet Payton Mccallum, MD     1. diagnosis reviewed with patient 2. rx as per orders above; reviewed possible side effects, interactions, risks and benefits  3. Recommend supportive treatment as above 4. Follow-up prn if symptoms worsen or don't improve   Controlled Substance Prescriptions [place] Controlled Substance Registry consulted? Not Applicable   Payton Mccallum, MD 02/21/18 [phone]

## 2018-04-13 ENCOUNTER — Encounter: Payer: Self-pay | Admitting: Emergency Medicine

## 2018-04-13 ENCOUNTER — Other Ambulatory Visit: Payer: Self-pay

## 2018-04-13 ENCOUNTER — Ambulatory Visit
Admission: EM | Admit: 2018-04-13 | Discharge: 2018-04-13 | Disposition: A | Discharge status: Home / Self Care | Payer: BLUE CROSS/BLUE SHIELD | Attending: Family Medicine | Admitting: Family Medicine

## 2018-04-13 DIAGNOSIS — J111 Influenza due to unidentified influenza virus with other respiratory manifestations: Secondary | ICD-10-CM

## 2018-04-13 DIAGNOSIS — R05 Cough: Secondary | ICD-10-CM

## 2018-04-13 DIAGNOSIS — R509 Fever, unspecified: Secondary | ICD-10-CM | POA: Diagnosis not present

## 2018-04-13 DIAGNOSIS — R69 Illness, unspecified: Secondary | ICD-10-CM

## 2018-04-13 LAB — RAPID STREP SCREEN (MED CTR MEBANE ONLY): STREPTOCOCCUS, GROUP A SCREEN (DIRECT): NEGATIVE

## 2018-04-13 MED ORDER — HYDROCOD POLST-CPM POLST ER 10-8 MG/5ML PO SUER: 5.0000 mL | Freq: Two times a day (BID) | ORAL | 0 refills | Status: DC | PRN

## 2018-04-13 MED ORDER — OSELTAMIVIR PHOSPHATE 75 MG PO CAPS
75.0000 mg | ORAL_CAPSULE | Freq: Two times a day (BID) | ORAL | 0 refills | Status: DC
Start: 2018-04-13 — End: 2018-04-20

## 2018-04-13 NOTE — ED Provider Notes (Signed)
[facility]    CSN: [id number] Arrival date & time: 04/13/18  1732     History   Chief Complaint Chief Complaint  Patient presents with  . Cough    appt  . Sore Throat    HPI Dorothy Nguyen is a 23 y.o. female.   The history is provided by the patient.  URI  Presenting symptoms: congestion, cough, fatigue, fever and rhinorrhea   Severity:  Moderate Duration:  1 day Timing:  Constant Progression:  Unchanged Chronicity:  New Relieved by:  None tried Ineffective treatments:  None tried Associated symptoms: myalgias   Associated symptoms: no wheezing   Risk factors: sick contacts     Past Medical History:  Diagnosis Date  . Endometriosis   . Headache     Patient Active Problem List   Diagnosis Date Noted  . Chronic pelvic pain in female 08/30/2014    Past Surgical History:  Procedure Laterality Date  . LAPAROSCOPY N/A 08/30/2014   Procedure: LAPAROSCOPY DIAGNOSTIC;  Surgeon: Conard NovakStephen D Jackson, MD;  Location: [facility] ORS;  Service: Gynecology;  Laterality: N/A;  . TYMPANOSTOMY TUBE PLACEMENT     2 times    OB History   No obstetric history on file.      Home Medications    Prior to Admission medications   Medication Sig Start Date End Date Taking? Authorizing Provider  amoxicillin (AMOXIL) 875 MG tablet Take 1 tablet (875 mg total) by mouth 2 (two) times daily. 02/21/18   Payton Mccallumonty, Madoc Holquin, MD  chlorpheniramine-HYDROcodone (TUSSIONEX PENNKINETIC ER) 10-8 MG/5ML SUER Take 5 mLs by mouth every 12 (twelve) hours as needed. 04/13/18   Payton Mccallumonty, Tahjanae Blankenburg, MD  fluconazole (DIFLUCAN) 150 MG tablet Take 1 tablet (150 mg total) by mouth daily. 02/21/18   Payton Mccallumonty, Adriana Quinby, MD  HYDROcodone-acetaminophen (NORCO) 5-325 MG per tablet Take 1 tablet by mouth every 6 (six) hours as needed for moderate pain. 08/30/14   Conard NovakJackson, Stephen D, MD  ibuprofen (ADVIL,MOTRIN) 600 MG tablet Take 1 tablet (600 mg total) by mouth every 8 (eight) hours as needed. 04/18/17   Irean HongSung, Jade J,  MD  ondansetron (ZOFRAN ODT) 4 MG disintegrating tablet Take 1 tablet (4 mg total) by mouth every 8 (eight) hours as needed for nausea or vomiting. 04/18/17   Irean HongSung, Jade J, MD  oseltamivir (TAMIFLU) 75 MG capsule Take 1 capsule (75 mg total) by mouth 2 (two) times daily. 04/13/18   Payton Mccallumonty, Alley Neils, MD  oxyCODONE-acetaminophen (PERCOCET/ROXICET) 5-325 MG tablet Take 1 tablet by mouth every 4 (four) hours as needed for severe pain. 04/18/17   Irean HongSung, Jade J, MD    Family History Family History  Problem Relation Age of Onset  . Healthy Mother   . Healthy Father     Social History Social History   Tobacco Use  . Smoking status: Current Every Day Smoker    Packs/day: 0.50    Types: Cigarettes  . Smokeless tobacco: Never Used  Substance Use Topics  . Alcohol use: Yes    Comment: occ  . Drug use: No     Allergies   Patient has no known allergies.   Review of Systems Review of Systems  Constitutional: Positive for fatigue and fever.  HENT: Positive for congestion and rhinorrhea.   Respiratory: Positive for cough. Negative for wheezing.   Musculoskeletal: Positive for myalgias.     Physical Exam Triage Vital Signs ED Triage Vitals  Enc Vitals Group     BP 04/13/18 1759 104/75  Pulse Rate 04/13/18 1759 (!) 105     Resp 04/13/18 1759 18     Temp 04/13/18 1759 (!) 100.7 F (38.2 C)     Temp Source 04/13/18 1759 Oral     SpO2 04/13/18 1759 100 %     Weight 04/13/18 1757 170 lb (77.1 kg)     Height 04/13/18 1757 5\' 5"  (1.651 m)     Head Circumference --      Peak Flow --      Pain Score 04/13/18 1756 6     Pain Loc --      Pain Edu? --      Excl. in GC? --    No data found.  Updated Vital Signs BP 104/75 (BP Location: Left Arm)   Pulse (!) 105   Temp (!) 100.7 F (38.2 C) (Oral)   Resp 18   Ht 5\' 5"  (1.651 m)   Wt 77.1 kg   LMP 04/06/2018 (Approximate)   SpO2 100%   BMI 28.29 kg/m   Visual Acuity Right Eye Distance:   Left Eye Distance:   Bilateral  Distance:    Right Eye Near:   Left Eye Near:    Bilateral Near:     Physical Exam Vitals signs and nursing note reviewed.  Constitutional:      General: She is not in acute distress.    Appearance: She is well-developed. She is not toxic-appearing or diaphoretic.  HENT:     Head: Normocephalic and atraumatic.     Right Ear: Tympanic membrane, ear canal and external ear normal.     Left Ear: Tympanic membrane, ear canal and external ear normal.     Nose: Rhinorrhea present.     Mouth/Throat:     Pharynx: Uvula midline. Posterior oropharyngeal erythema present. No oropharyngeal exudate.  Eyes:     General: No scleral icterus.       Right eye: No discharge.        Left eye: No discharge.  Neck:     Musculoskeletal: Normal range of motion and neck supple.     Thyroid: No thyromegaly.  Cardiovascular:     Rate and Rhythm: Normal rate and regular rhythm.     Heart sounds: Normal heart sounds.  Pulmonary:     Effort: Pulmonary effort is normal. No respiratory distress.     Breath sounds: Normal breath sounds. No stridor. No wheezing, rhonchi or rales.  Lymphadenopathy:     Cervical: No cervical adenopathy.  Neurological:     Mental Status: She is alert.      UC Treatments / Results  Labs (all labs ordered are listed, but only abnormal results are displayed) Labs Reviewed  RAPID STREP SCREEN ([facility] ONLY)  CULTURE, GROUP A STREP [facility])    EKG None  Radiology No results found.  Procedures Procedures (including critical care time)  Medications Ordered in UC Medications - No data to display  Initial Impression / Assessment and Plan / UC Course  I have reviewed the triage vital signs and the nursing notes.  Pertinent labs & imaging results that were available during my care of the patient were reviewed by me and considered in my medical decision making (see chart for details).      Final Clinical Impressions(s) / UC Diagnoses   Final diagnoses:    Influenza-like illness    ED Prescriptions    Medication Sig Dispense Auth. Provider   oseltamivir (TAMIFLU) 75 MG capsule Take 1 capsule (75 mg  total) by mouth 2 (two) times daily. 10 capsule Payton Mccallum, MD   chlorpheniramine-HYDROcodone (TUSSIONEX PENNKINETIC ER) 10-8 MG/5ML SUER Take 5 mLs by mouth every 12 (twelve) hours as needed. 60 mL Payton Mccallum, MD      1. diagnosis reviewed with patient 2. rx as per orders above; reviewed possible side effects, interactions, risks and benefits  3. Recommend supportive treatment with rest, fluids, otc analgesics  4. Follow-up prn if symptoms worsen or don't improve   Controlled Substance Prescriptions [place] Controlled Substance Registry consulted? Not Applicable   Payton Mccallum, MD 04/13/18 [phone]

## 2018-04-13 NOTE — ED Triage Notes (Signed)
Pt c/o headache, nausea, cough, fever, sore throat, and body aches. Started last night.

## 2018-04-16 LAB — CULTURE, GROUP A STREP (THRC)

## 2018-04-20 ENCOUNTER — Ambulatory Visit
Admission: EM | Admit: 2018-04-20 | Discharge: 2018-04-20 | Disposition: A | Discharge status: Home / Self Care | Payer: BLUE CROSS/BLUE SHIELD | Attending: Family Medicine | Admitting: Family Medicine

## 2018-04-20 ENCOUNTER — Encounter: Payer: Self-pay | Admitting: Emergency Medicine

## 2018-04-20 DIAGNOSIS — F17210 Nicotine dependence, cigarettes, uncomplicated: Secondary | ICD-10-CM

## 2018-04-20 DIAGNOSIS — J069 Acute upper respiratory infection, unspecified: Secondary | ICD-10-CM

## 2018-04-20 MED ORDER — PREDNISONE 50 MG PO TABS: ORAL_TABLET | ORAL | 0 refills | Status: DC

## 2018-04-20 MED ORDER — BENZONATATE 100 MG PO CAPS: 100.0000 mg | ORAL_CAPSULE | Freq: Three times a day (TID) | ORAL | 0 refills | Status: DC | PRN

## 2018-04-20 MED ORDER — IPRATROPIUM BROMIDE 0.06 % NA SOLN: 2.0000 | Freq: Four times a day (QID) | NASAL | 0 refills | Status: DC | PRN

## 2018-04-20 NOTE — Discharge Instructions (Signed)
Medication as prescribed. ° °Rest. Fluids. ° °Take care ° °Dr. Mohannad Olivero  °

## 2018-04-20 NOTE — ED Triage Notes (Signed)
Patient states she is getting over the flu from last week and has developed a bad dry cough and a lot of sinus and facial pressure

## 2018-04-20 NOTE — ED Provider Notes (Signed)
[facility]    CSN: [id number] Arrival date & time: 04/20/18  1449  History   Chief Complaint Chief Complaint  Patient presents with  . Nasal Congestion   HPI  23 year old female presents with the above complaint.  Patient recently diagnosed and treated for the flu.  Patient states that she continues to have dry cough and sinus pressure and congestion.  No additional fever.  She has been using prescribed occasion for cough without resolution.  Symptoms are mild to moderate in severity.  No reports of purulent nasal discharge.  No other associated symptoms.  No known exacerbating factors.  No other complaints.   PMH, Surgical Hx, Family Hx, Social History reviewed and updated as below.  Past Medical History:  Diagnosis Date  . Endometriosis   . Headache    Patient Active Problem List   Diagnosis Date Noted  . Chronic pelvic pain in female 08/30/2014   Past Surgical History:  Procedure Laterality Date  . LAPAROSCOPY N/A 08/30/2014   Procedure: LAPAROSCOPY DIAGNOSTIC;  Surgeon: Conard NovakStephen D Jackson, MD;  Location: [facility] ORS;  Service: Gynecology;  Laterality: N/A;  . TYMPANOSTOMY TUBE PLACEMENT     2 times   OB History   No obstetric history on file.    Home Medications    Prior to Admission medications   Medication Sig Start Date End Date Taking? Authorizing Provider  benzonatate (TESSALON) 100 MG capsule Take 1 capsule (100 mg total) by mouth 3 (three) times daily as needed. 04/20/18   Tommie Samsook, Deitrick Ferreri G, DO  ipratropium (ATROVENT) 0.06 % nasal spray Place 2 sprays into both nostrils 4 (four) times daily as needed for rhinitis. 04/20/18   Tommie Samsook, Eber Ferrufino G, DO  predniSONE (DELTASONE) 50 MG tablet 1 tablet daily x 5 days 04/20/18   Tommie Samsook, Demauri Advincula G, DO   Family History Family History  Problem Relation Age of Onset  . Healthy Mother   . Healthy Father    Social History Social History   Tobacco Use  . Smoking status: Current Every Day Smoker    Packs/day: 0.50   Types: Cigarettes  . Smokeless tobacco: Never Used  Substance Use Topics  . Alcohol use: Yes    Comment: occ  . Drug use: No   Allergies   Patient has no known allergies.  Review of Systems Review of Systems  HENT: Positive for congestion.   Respiratory: Positive for cough.    Physical Exam Triage Vital Signs ED Triage Vitals  Enc Vitals Group     BP 04/20/18 1507 99/63     Pulse Rate 04/20/18 1507 83     Resp 04/20/18 1507 18     Temp 04/20/18 1507 (!) 97.5 F (36.4 C)     Temp Source 04/20/18 1507 Oral     SpO2 04/20/18 1505 100 %     Weight 04/20/18 1505 170 lb (77.1 kg)     Height 04/20/18 1505 5\' 5"  (1.651 m)     Head Circumference --      Peak Flow --      Pain Score 04/20/18 1504 4     Pain Loc --      Pain Edu? --      Excl. in GC? --    Updated Vital Signs BP 99/63 (BP Location: Left Arm)   Pulse 83   Temp (!) 97.5 F (36.4 C) (Oral)   Resp 18   Ht 5\' 5"  (1.651 m)   Wt 77.1 kg   LMP 04/06/2018 (  Approximate)   SpO2 100%   BMI 28.29 kg/m   Visual Acuity Right Eye Distance:   Left Eye Distance:   Bilateral Distance:    Right Eye Near:   Left Eye Near:    Bilateral Near:     Physical Exam Vitals signs and nursing note reviewed.  Constitutional:      General: She is not in acute distress.    Appearance: Normal appearance.  HENT:     Head: Normocephalic and atraumatic.     Mouth/Throat:     Pharynx: Oropharynx is clear. No posterior oropharyngeal erythema.  Eyes:     General:        Right eye: No discharge.        Left eye: No discharge.     Conjunctiva/sclera: Conjunctivae normal.  Cardiovascular:     Rate and Rhythm: Normal rate and regular rhythm.  Pulmonary:     Effort: Pulmonary effort is normal.     Breath sounds: Normal breath sounds.  Neurological:     Mental Status: She is alert.  Psychiatric:        Mood and Affect: Mood normal.        Behavior: Behavior normal.    UC Treatments / Results  Labs (all labs ordered are  listed, but only abnormal results are displayed) Labs Reviewed - No data to display  EKG None  Radiology No results found.  Procedures Procedures (including critical care time)  Medications Ordered in UC Medications - No data to display  Initial Impression / Assessment and Plan / UC Course  I have reviewed the triage vital signs and the nursing notes.  Pertinent labs & imaging results that were available during my care of the patient were reviewed by me and considered in my medical decision making (see chart for details).    23 year old female presents with continued cough and congestion following influenza.  Treating with aspirin nasal spray, Tessalon Perles, prednisone.  Supportive care.  Final Clinical Impressions(s) / UC Diagnoses   Final diagnoses:  Upper respiratory infection with cough and congestion     Discharge Instructions     Medication as prescribed.  Rest. Fluids.  Take care  Dr. Adriana Simasook    ED Prescriptions    Medication Sig Dispense Auth. Provider   benzonatate (TESSALON) 100 MG capsule Take 1 capsule (100 mg total) by mouth 3 (three) times daily as needed. 30 capsule Timarie Labell G, DO   ipratropium (ATROVENT) 0.06 % nasal spray Place 2 sprays into both nostrils 4 (four) times daily as needed for rhinitis. 15 mL Bentzion Dauria G, DO   predniSONE (DELTASONE) 50 MG tablet 1 tablet daily x 5 days 5 tablet Tommie Samsook, Gerad Cornelio G, DO     Controlled Substance Prescriptions [place] Controlled Substance Registry consulted? Not Applicable   Tommie SamsCook, Shakai Dolley G, DO 04/20/18 1740

## 2019-01-05 ENCOUNTER — Other Ambulatory Visit: Payer: Self-pay

## 2019-01-05 ENCOUNTER — Ambulatory Visit
Admission: EM | Admit: 2019-01-05 | Discharge: 2019-01-05 | Disposition: A | Discharge status: Home / Self Care | Payer: BC Managed Care – PPO | Attending: Urgent Care | Admitting: Urgent Care

## 2019-01-05 ENCOUNTER — Encounter: Payer: Self-pay | Admitting: Emergency Medicine

## 2019-01-05 DIAGNOSIS — B3731 Acute candidiasis of vulva and vagina: Secondary | ICD-10-CM

## 2019-01-05 DIAGNOSIS — N760 Acute vaginitis: Secondary | ICD-10-CM

## 2019-01-05 DIAGNOSIS — B373 Candidiasis of vulva and vagina: Secondary | ICD-10-CM | POA: Diagnosis not present

## 2019-01-05 DIAGNOSIS — B9689 Other specified bacterial agents as the cause of diseases classified elsewhere: Secondary | ICD-10-CM | POA: Diagnosis not present

## 2019-01-05 LAB — WET PREP, GENITAL
Sperm: NONE SEEN
Trich, Wet Prep: NONE SEEN

## 2019-01-05 MED ORDER — FLUCONAZOLE 150 MG PO TABS: ORAL_TABLET | ORAL | 0 refills | Status: DC

## 2019-01-05 MED ORDER — METRONIDAZOLE 500 MG PO TABS: 500.0000 mg | ORAL_TABLET | Freq: Two times a day (BID) | ORAL | 0 refills | Status: DC

## 2019-01-05 NOTE — ED Provider Notes (Signed)
[place], [place]   Name: Dorothy Nguyen DOB: 1996/01/06 MRN: [id number] CSN: [id number] PCP: Patient, No Pcp Per  Arrival date and time:  01/05/19 5093  Chief Complaint:  Vaginal Itching and Vaginal Discharge   NOTE: Prior to seeing the patient today, I have reviewed the triage nursing documentation and vital signs. Clinical staff has updated patient's PMH/PSHx, current medication list, and drug allergies/intolerances to ensure comprehensive history available to assist in medical decision making.   History:   HPI: Dorothy Nguyen is a 23 y.o. female who presents today with complaints of vaginal discharge  And itching that began approximately 4 days ago. Discharge is reported to be whitein color. Patient describes the discharge has having no odor. She denies any associated vaginal/pelvic pain. She has not appreciated any bleeding. Patient has not experienced any concurrent urinary symptoms; no dysuria, frequency, urgency, or gross hematuria. Patient denies any associated nausea, vomiting, fever/chills, or pain in her lower back or flank areas. She has some mild cramping in her abdomen. Patient advises that she does not have a significant history for STIs in the past. Patient endorses that she engages in unprotected sexual activity. She notes that sexual activity is in the context of a committed monogamous relationship with a single female partner. She denies any vaginal pain, bleeding, or discharge. Patient's last menstrual period was 12/15/2018. There are no concerns that she is currently pregnant.   Past Medical History:  Diagnosis Date  . Endometriosis   . Headache     Past Surgical History:  Procedure Laterality Date  . LAPAROSCOPY N/A 08/30/2014   Procedure: LAPAROSCOPY DIAGNOSTIC;  Surgeon: Conard Novak, MD;  Location: [facility] ORS;  Service: Gynecology;  Laterality: N/A;  . TYMPANOSTOMY TUBE PLACEMENT     2 times    Family History  Problem Relation Age of Onset  . Healthy  Mother   . Healthy Father     Social History   Tobacco Use  . Smoking status: Former Smoker    Packs/day: 0.50    Types: Cigarettes  . Smokeless tobacco: Never Used  Substance Use Topics  . Alcohol use: Yes    Comment: occ  . Drug use: No    Patient Active Problem List   Diagnosis Date Noted  . Chronic pelvic pain in female 08/30/2014    Home Medications:    No outpatient medications have been marked as taking for the 01/05/19 encounter [facility] Encounter).    Allergies:   Patient has no known allergies.  Review of Systems (ROS): Review of Systems  Constitutional: Negative for chills and fever.  Respiratory: Negative for cough and shortness of breath.   Cardiovascular: Negative for chest pain and palpitations.  Gastrointestinal: Positive for abdominal pain (crapming). Negative for nausea and vomiting.  Genitourinary: Positive for vaginal discharge. Negative for decreased urine volume, dysuria, flank pain, frequency, hematuria, pelvic pain, urgency, vaginal bleeding and vaginal pain.  Musculoskeletal: Negative for back pain.  Skin: Negative for color change, pallor and rash.  Neurological: Negative for dizziness, syncope, weakness and headaches.  All other systems reviewed and are negative.    Vital Signs: Today's Vitals   01/05/19 0937 01/05/19 0938 01/05/19 1000  BP: 107/74    Pulse: 98    Resp: 18    Temp: 98.3 F (36.8 C)    TempSrc: Oral    SpO2: 99%    Weight:  170 lb (77.1 kg)   Height:  5\' 5"  (1.651 m)   PainSc:  0-No pain  0-No pain    Physical Exam: Physical Exam  Constitutional: She is oriented to person, place, and time and well-developed, well-nourished, and in no distress.  HENT:  Head: Normocephalic and atraumatic.  Mouth/Throat: Mucous membranes are normal.  Eyes: Pupils are equal, round, and reactive to light.  Cardiovascular: Normal rate, regular rhythm, normal heart sounds and intact distal pulses. Exam reveals no gallop and no  friction rub.  No murmur heard. Pulmonary/Chest: Effort normal and breath sounds normal. No respiratory distress. She has no wheezes. She has no rales.  Abdominal: Soft. Normal appearance. There is no abdominal tenderness. There is no CVA tenderness.  Genitourinary:    Genitourinary Comments: Exam deferred. No vaginal/pelvic pain or bleeding. Patient is not currently pregnant. She has elected to self collect specimen swab for wet prep.   Neurological: She is alert and oriented to person, place, and time. Gait normal.  Skin: Skin is warm and dry. No rash noted.  Psychiatric: Mood, memory, affect and judgment normal.  Nursing note and vitals reviewed.   Urgent Care Treatments / Results:   LABS: PLEASE NOTE: all labs that were ordered this encounter are listed, however only abnormal results are displayed. Labs Reviewed  WET PREP, GENITAL - Abnormal; Notable for the following components:      Result Value   Yeast Wet Prep HPF POC PRESENT (*)    Clue Cells Wet Prep HPF POC PRESENT (*)    WBC, Wet Prep HPF POC MANY (*)    All other components within normal limits    EKG: -None  RADIOLOGY: No results found.  PROCEDURES: Procedures  MEDICATIONS RECEIVED THIS VISIT: Medications - No data to display  PERTINENT CLINICAL COURSE NOTES/UPDATES:   Initial Impression / Assessment and Plan / Urgent Care Course:  Pertinent labs & imaging results that were available during my care of the patient were personally reviewed by me and considered in my medical decision making (see lab/imaging section of note for values and interpretations).  Dorothy Nguyen is a 23 y.o. female who [facility] with complaints of Vaginal Itching and Vaginal Discharge   Patient is well appearing overall in clinic today. She does not appear to be in any acute distress. Presenting symptoms (see HPI) and exam as documented above. Wet prep (+) for clue cells, which is consistent with  bacterial vaginosis (BV) infection. Will treat with a 7 day course of oral metronidazole. Patient encouraged to complete the entire course of antibiotics even if she begins to feel better. Educated on need to avoid all ETOH while she is taking this medication in order to prevent a disulfiram like reaction that will result in significant nausea and vomiting. Sample also (+) for candida, which will be treated with fluconazole dose (150 mg x 1 - may repeat in 72 hours if still symptomatic) for patient to use for her vaginal itching. Patient encouraged to increase her fluid intake as much as possible. Discussed that water is always best to flush the urinary tract and prevent development of a urinary tract infection while on treatment for the BV.   Discussed follow up with primary care physician in 1 week for re-evaluation. I have reviewed the follow up and strict return precautions for any new or worsening symptoms. Patient is aware of symptoms that would be deemed urgent/emergent, and would thus require further evaluation either here or in the emergency department. At the time of discharge, she verbalized understanding and consent with the discharge plan as  it was reviewed with her. All questions were fielded by provider and/or clinic staff prior to patient discharge.    Final Clinical Impressions / Urgent Care Diagnoses:   Final diagnoses:  BV (bacterial vaginosis)  Vaginal yeast infection    New Prescriptions:   Controlled Substance Registry consulted? Not Applicable  Meds ordered this encounter  Medications  . metroNIDAZOLE (FLAGYL) 500 MG tablet    Sig: Take 1 tablet (500 mg total) by mouth 2 (two) times daily.    Dispense:  14 tablet    Refill:  0  . fluconazole (DIFLUCAN) 150 MG tablet    Sig: Take 1 tablet (150 mg) PO x 1 dose. May repeat 150 mg dose in 3 days if still symptomatic.    Dispense:  2 tablet    Refill:  0    Recommended Follow up Care:  Patient encouraged to follow up  with the following provider within the specified time frame, or sooner as dictated by the severity of her symptoms. As always, she was instructed that for any urgent/emergent care needs, she should seek care either here or in the emergency department for more immediate evaluation.  Follow-up Information    PCP In 1 week.   Why: General reassessment of symptoms if not improving        NOTE: This note was prepared using Lobbyist along with smaller Company secretary. Despite my best ability to proofread, there is the potential that transcriptional errors may still occur from this process, and are completely unintentional.    Karen Kitchens, NP 01/05/19 1005

## 2019-01-05 NOTE — Discharge Instructions (Signed)
It was very nice seeing you today in clinic. Thank you for entrusting me with your care.   Increase fluid intake as much as possible in efforts to keep urinary tract flushed. Avoid alcohol while taking the antibiotics, as this medication can cause a reaction that will make you sick (nausea and vomiting) if you drink.   Make arrangements to follow up with your regular doctor in 1 week for re-evaluation if not improving. If your symptoms/condition worsens, please seek follow up care either here or in the ER. Please remember, our [place] providers are "right here with you" when you need [place].   Again, it was my pleasure to take care of you today. Thank you for choosing our clinic. I hope that you start to feel better quickly.   Honor Loh, MSN, APRN, FNP-C, CEN Advanced Practice Provider [facility]

## 2019-01-05 NOTE — ED Triage Notes (Signed)
Patient c/o white vaginal discharge and itching that started 4 days ago. Patient has not tried any OTC medications.

## 2019-02-28 ENCOUNTER — Other Ambulatory Visit: Payer: Self-pay

## 2019-02-28 ENCOUNTER — Encounter: Payer: Self-pay | Admitting: Emergency Medicine

## 2019-02-28 ENCOUNTER — Ambulatory Visit
Admission: EM | Admit: 2019-02-28 | Discharge: 2019-02-28 | Disposition: A | Discharge status: Home / Self Care | Payer: BC Managed Care – PPO | Attending: Urgent Care | Admitting: Urgent Care

## 2019-02-28 DIAGNOSIS — J029 Acute pharyngitis, unspecified: Secondary | ICD-10-CM

## 2019-02-28 DIAGNOSIS — H6693 Otitis media, unspecified, bilateral: Secondary | ICD-10-CM

## 2019-02-28 DIAGNOSIS — Z7189 Other specified counseling: Secondary | ICD-10-CM

## 2019-02-28 LAB — GROUP A STREP BY PCR: Group A Strep by PCR: NOT DETECTED

## 2019-02-28 MED ORDER — AMOXICILLIN 875 MG PO TABS: 875.0000 mg | ORAL_TABLET | Freq: Two times a day (BID) | ORAL | 0 refills | Status: DC

## 2019-02-28 MED ORDER — FLUCONAZOLE 150 MG PO TABS: 150.0000 mg | ORAL_TABLET | Freq: Every day | ORAL | 0 refills | Status: DC

## 2019-02-28 NOTE — Discharge Instructions (Signed)
Take medication as prescribed. Rest. Drink plenty of fluids. Over the counter decongestants.  ° °Follow up with your primary care physician this week as needed. Return to Urgent care for new or worsening concerns.  ° °

## 2019-02-28 NOTE — ED Provider Notes (Addendum)
[facility] ____________________________________________  Time seen: Approximately 12:57 PM  I have reviewed the triage vital signs and the nursing notes.   HISTORY  Chief Complaint Sore Throat and Otalgia  HPI Dorothy Nguyen is a 23 y.o. female presenting for evaluation of 2 weeks of bilateral ear discomfort and some sore throat.  States ears are gradually feeling more pain, particular right as well as fluid sensation.  States she has had a couple of days also with accompanying nasal congestion and occasional cough.  Denies current chest pain, shortness of breath, changes in taste or smell.  Denies fevers, vomiting, diarrhea.  Denies known sick contacts.  Denies aggravating alleviating factors.  Reports history of recurrent ear infections with similar presentation.   Past Medical History:  Diagnosis Date  . Endometriosis   . Headache     Patient Active Problem List   Diagnosis Date Noted  . Chronic pelvic pain in female 08/30/2014    Past Surgical History:  Procedure Laterality Date  . LAPAROSCOPY N/A 08/30/2014   Procedure: LAPAROSCOPY DIAGNOSTIC;  Surgeon: Will Bonnet, MD;  Location: [facility] ORS;  Service: Gynecology;  Laterality: N/A;  . TYMPANOSTOMY TUBE PLACEMENT     2 times     No current facility-administered medications for this encounter.  Current Outpatient Medications:  .  amoxicillin (AMOXIL) 875 MG tablet, Take 1 tablet (875 mg total) by mouth 2 (two) times daily., Disp: 20 tablet, Rfl: 0 .  fluconazole (DIFLUCAN) 150 MG tablet, Take 1 tablet (150 mg total) by mouth daily. Take one pill orally once as needed., Disp: 1 tablet, Rfl: 0  Allergies Patient has no known allergies.  Family History  Problem Relation Age of Onset  . Healthy Mother   . Healthy Father     Social History Social History   Tobacco Use  . Smoking status: Former Smoker    Packs/day: 0.50    Types: Cigarettes  . Smokeless tobacco: Never Used  Substance Use  Topics  . Alcohol use: Never  . Drug use: No    Review of Systems Constitutional: No fever ENT: As above. Cardiovascular: Denies chest pain. Respiratory: Denies shortness of breath. Gastrointestinal: No abdominal pain.  No nausea, no vomiting.  No diarrhea.  Musculoskeletal: Negative for back pain. Skin: Negative for rash.  ____________________________________________   PHYSICAL EXAM:  VITAL SIGNS: ED Triage Vitals  Enc Vitals Group     BP 02/28/19 1144 102/68     Pulse Rate 02/28/19 1144 81     Resp 02/28/19 1144 16     Temp 02/28/19 1144 98.2 F (36.8 C)     Temp Source 02/28/19 1144 Oral     SpO2 02/28/19 1144 100 %     Weight 02/28/19 1142 168 lb (76.2 kg)     Height 02/28/19 1142 5\' 4"  (1.626 m)     Head Circumference --      Peak Flow --      Pain Score 02/28/19 1142 4     Pain Loc --      Pain Edu? --      Excl. in [place]? --    Constitutional: Alert and oriented. Well appearing and in no acute distress. Eyes: Conjunctivae are normal.  Head: Atraumatic. No sinus tenderness to palpation. No swelling. No erythema.  Ears:   Left: Nontender, normal canal, moderate erythema with effusion present.  Right: Nontender, normal canal, mild erythema with effusion present.  Nose:Nasal congestion  Mouth/Throat: Mucous membranes are moist. No pharyngeal erythema.  No tonsillar swelling or exudate.  Neck: No stridor.  No cervical spine tenderness to palpation. Hematological/Lymphatic/Immunilogical: No cervical lymphadenopathy. Cardiovascular: Normal rate, regular rhythm. Grossly normal heart sounds.  Good peripheral circulation. Respiratory: Normal respiratory effort. No retractions. No wheezes, rales or rhonchi. Good air movement.  Musculoskeletal: Ambulatory with steady gait.  Neurologic:  Normal speech and language. No gait instability. Skin:  Skin appears warm, dry and intact. No rash noted. Psychiatric: Mood and affect are normal. Speech and behavior are normal.     ___________________________________________   LABS (all labs ordered are listed, but only abnormal results are displayed)  Labs Reviewed  GROUP A STREP BY PCR  NOVEL CORONAVIRUS, NAA (HOSP ORDER, SEND-OUT TO REF LAB; TAT 18-24 HRS)   ____________________________________   PROCEDURES Procedures   INITIAL IMPRESSION / ASSESSMENT AND PLAN / ED COURSE  Pertinent labs & imaging results that were available during my care of the patient were reviewed by me and considered in my medical decision making (see chart for details).  Well-appearing patient.  No acute distress.  Strep negative.  Covid testing completed, advice given.  Patient with bilateral otitis with effusion, recommend over-the-counter decongestants and will start on oral amoxicillin. Prophylactic diflucan given as requested. encourage rest, fluids, supportive care.Discussed indication, risks and benefits of medications with patient.   Discussed follow up with Primary care physician this week. Discussed follow up and return parameters including no resolution or any worsening concerns. Patient verbalized understanding and agreed to plan.   ____________________________________________   FINAL CLINICAL IMPRESSION(S) / ED DIAGNOSES  Final diagnoses:  Bilateral otitis media, unspecified otitis media type  Pharyngitis, unspecified etiology  Advice given about COVID-19 virus infection     ED Discharge Orders         Ordered    amoxicillin (AMOXIL) 875 MG tablet  2 times daily     02/28/19 1220    fluconazole (DIFLUCAN) 150 MG tablet  Daily     02/28/19 1220           Note: This dictation was prepared with Dragon dictation along with smaller phrase technology. Any transcriptional errors that result from this process are unintentional.         Renford Dills, NP 02/28/19 1301

## 2019-02-28 NOTE — ED Triage Notes (Signed)
Patient c/o bilateral ear pain and sore throat that started 2 weeks ago. She also reports a headache and cough that started a few days ago.

## 2019-03-01 LAB — NOVEL CORONAVIRUS, NAA (HOSP ORDER, SEND-OUT TO REF LAB; TAT 18-24 HRS): SARS-CoV-2, NAA: NOT DETECTED

## 2019-03-13 ENCOUNTER — Ambulatory Visit
Admission: EM | Admit: 2019-03-13 | Discharge: 2019-03-13 | Disposition: A | Discharge status: Home / Self Care | Payer: BC Managed Care – PPO | Attending: Nurse Practitioner | Admitting: Nurse Practitioner

## 2019-03-13 ENCOUNTER — Other Ambulatory Visit: Payer: Self-pay

## 2019-03-13 DIAGNOSIS — F458 Other somatoform disorders: Secondary | ICD-10-CM

## 2019-03-13 DIAGNOSIS — R6884 Jaw pain: Secondary | ICD-10-CM

## 2019-03-13 MED ORDER — CYCLOBENZAPRINE HCL 10 MG PO TABS: 10.0000 mg | ORAL_TABLET | Freq: Every day | ORAL | 0 refills | Status: DC

## 2019-03-13 MED ORDER — IBUPROFEN 800 MG PO TABS: 800.0000 mg | ORAL_TABLET | Freq: Three times a day (TID) | ORAL | 0 refills | Status: DC | PRN

## 2019-03-13 NOTE — ED Triage Notes (Signed)
Pt. States for a month she has had jaw pain, states she grinds her teeth everyday.

## 2019-03-13 NOTE — Discharge Instructions (Signed)
Take medications as prescribed. Start using a mouth guard nightly. Take muscle relaxer at night to help relax your jaw muscles.

## 2019-03-13 NOTE — ED Provider Notes (Signed)
[facility]    CSN: [id number] Arrival date & time: 03/13/19  1443      History   Chief Complaint Chief Complaint  Patient presents with  . Jaw Pain    HPI Dorothy Nguyen is a 24 y.o. female.   Subjective:   Dorothy Nguyen is a 24 y.o. female who presents for possible ear infection. Symptoms include bilateral ear pain, plugged sensation in both ears and jaw pain. Onset of symptoms was several weeks ago and has been unchanged since that time. She was evaluated on 02/28/19 for similar complaints. She was diagnosed with bilateral OM and has completed a course of antibiotics. Associated symptoms include congestion. She denies any fevers, chills, body aches, sore throat, cough, shortness of breath, headache, dizziness, change in taste/smell.  She is drinking plenty of fluids Notably, the patient states that she grinds her teeth a lot especially at night.  She has ordered a mouthguard.  States that the jaw pain could be from that.  The following portions of the patient's history were reviewed and updated as appropriate: allergies, current medications, past family history, past medical history, past social history, past surgical history and problem list.       Past Medical History:  Diagnosis Date  . Endometriosis   . Headache     Patient Active Problem List   Diagnosis Date Noted  . Chronic pelvic pain in female 08/30/2014    Past Surgical History:  Procedure Laterality Date  . LAPAROSCOPY N/A 08/30/2014   Procedure: LAPAROSCOPY DIAGNOSTIC;  Surgeon: Will Bonnet, MD;  Location: [facility] ORS;  Service: Gynecology;  Laterality: N/A;  . TYMPANOSTOMY TUBE PLACEMENT     2 times    OB History   No obstetric history on file.      Home Medications    Prior to Admission medications   Medication Sig Start Date End Date Taking? Authorizing Provider  cyclobenzaprine (FLEXERIL) 10 MG tablet Take 1 tablet (10 mg total) by mouth at bedtime. to relax your jaw  muscles at night 03/13/19   Enrique Sack, FNP  ibuprofen (ADVIL) 800 MG tablet Take 1 tablet (800 mg total) by mouth every 8 (eight) hours as needed. 03/13/19   Enrique Sack, FNP  ipratropium (ATROVENT) 0.06 % nasal spray Place 2 sprays into both nostrils 4 (four) times daily as needed for rhinitis. 04/20/18 01/05/19  Coral Spikes, DO    Family History Family History  Problem Relation Age of Onset  . Healthy Mother   . Healthy Father     Social History Social History   Tobacco Use  . Smoking status: Former Smoker    Packs/day: 0.50    Types: Cigarettes  . Smokeless tobacco: Never Used  Substance Use Topics  . Alcohol use: Never  . Drug use: No     Allergies   Patient has no known allergies.   Review of Systems Review of Systems  Constitutional: Negative for fever.  HENT: Positive for congestion and ear pain. Negative for sore throat and trouble swallowing.   Respiratory: Negative.   Gastrointestinal: Negative.   Musculoskeletal: Negative.   Neurological: Negative.   All other systems reviewed and are negative.    Physical Exam Triage Vital Signs ED Triage Vitals  Enc Vitals Group     BP 03/13/19 1525 104/66     Pulse Rate 03/13/19 1525 90     Resp 03/13/19 1525 17     Temp 03/13/19 1525 98.3 F (36.8 C)  Temp Source 03/13/19 1525 Oral     SpO2 03/13/19 1525 100 %     Weight 03/13/19 1522 170 lb 8 oz (77.3 kg)     Height --      Head Circumference --      Peak Flow --      Pain Score 03/13/19 1522 3     Pain Loc --      Pain Edu? --      Excl. in [place]? --    No data found.  Updated Vital Signs BP 104/66 (BP Location: Left Arm)   Pulse 90   Temp 98.3 F (36.8 C) (Oral)   Resp 17   Wt 170 lb 8 oz (77.3 kg)   LMP 02/14/2019   SpO2 100%   BMI 29.27 kg/m   Visual Acuity Right Eye Distance:   Left Eye Distance:   Bilateral Distance:    Right Eye Near:   Left Eye Near:    Bilateral Near:     Physical Exam Vitals reviewed.    Constitutional:      General: She is not in acute distress.    Appearance: Normal appearance. She is not ill-appearing, toxic-appearing or diaphoretic.  HENT:     Head: Normocephalic.     Jaw: There is normal jaw occlusion. No trismus, tenderness, swelling, pain on movement or malocclusion.     Right Ear: Tympanic membrane, ear canal and external ear normal.     Left Ear: Tympanic membrane, ear canal and external ear normal.     Nose: Nose normal.     Mouth/Throat:     Lips: Pink. No lesions.     Mouth: Mucous membranes are moist.     Dentition: No gingival swelling or dental abscesses.     Pharynx: Oropharynx is clear. Uvula midline. No pharyngeal swelling or posterior oropharyngeal erythema.     Tonsils: No tonsillar exudate.   Eyes:     Pupils: Pupils are equal, round, and reactive to light.  Cardiovascular:     Rate and Rhythm: Normal rate and regular rhythm.  Pulmonary:     Effort: Pulmonary effort is normal.  Musculoskeletal:        General: Normal range of motion.     Cervical back: Normal range of motion and neck supple.  Skin:    General: Skin is warm and dry.  Neurological:     General: No focal deficit present.     Mental Status: She is alert and oriented to person, place, and time.  Psychiatric:        Mood and Affect: Mood normal.      UC Treatments / Results  Labs (all labs ordered are listed, but only abnormal results are displayed) Labs Reviewed - No data to display  EKG   Radiology No results found.  Procedures Procedures (including critical care time)  Medications Ordered in UC Medications - No data to display  Initial Impression / Assessment and Plan / UC Course  I have reviewed the triage vital signs and the nursing notes.  Pertinent labs & imaging results that were available during my care of the patient were reviewed by me and considered in my medical decision making (see chart for details).    24 year old female with bilateral ear  pain and jaw pain.  Physical exam unremarkable.  No indication of TMJ.  Symptoms likely due to her teeth grinding. Advised to use a mouthguard nightly.  Will prescribe a short course of muscle relaxers to use  at night.  Follow-up with dentistry next week as already scheduled for tooth extraction.  Follow-up with PCP if no improvement in symptoms.  Today's evaluation has revealed no signs of a dangerous process. Discussed diagnosis with patient and/or guardian. Patient and/or guardian aware of their diagnosis, possible red flag symptoms to watch out for and need for close follow up. Patient and/or guardian understands verbal and written discharge instructions. Patient and/or guardian comfortable with plan and disposition.  Patient and/or guardian has a clear mental status at this time, good insight into illness (after discussion and teaching) and has clear judgment to make decisions regarding their care  This care was provided during an unprecedented National Emergency due to the Novel Coronavirus (COVID-19) pandemic. COVID-19 infections and transmission risks place heavy strains on healthcare resources.  As this pandemic evolves, our facility, providers, and staff strive to respond fluidly, to remain operational, and to provide care relative to available resources and information. Outcomes are unpredictable and treatments are without well-defined guidelines. Further, the impact of COVID-19 on all aspects of urgent care, including the impact to patients seeking care for reasons other than COVID-19, is unavoidable during this national emergency. At this time of the global pandemic, management of patients has significantly changed, even for non-COVID positive patients given high local and regional COVID volumes at this time requiring high healthcare system and resource utilization. The standard of care for management of both COVID suspected and non-COVID suspected patients continues to change rapidly at the local,  regional, national, and global levels. This patient was worked up and treated to the best available but ever changing evidence and resources available at this current time.   Documentation was completed with the aid of voice recognition software. Transcription may contain typographical errors.  Final Clinical Impressions(s) / UC Diagnoses   Final diagnoses:  Jaw pain, non-TMJ  Bruxism     Discharge Instructions     Take medications as prescribed. Start using a mouth guard nightly. Take muscle relaxer at night to help relax your jaw muscles.     ED Prescriptions    Medication Sig Dispense Auth. Provider   cyclobenzaprine (FLEXERIL) 10 MG tablet Take 1 tablet (10 mg total) by mouth at bedtime. to relax your jaw muscles at night 20 tablet Lurline Idol, FNP   ibuprofen (ADVIL) 800 MG tablet Take 1 tablet (800 mg total) by mouth every 8 (eight) hours as needed. 21 tablet Lurline Idol, FNP     PDMP not reviewed this encounter.   Lurline Idol, FNP 03/13/19 1600

## 2019-05-28 ENCOUNTER — Emergency Department
Admission: EM | Admit: 2019-05-28 | Discharge: 2019-05-28 | Disposition: A | Discharge status: Home / Self Care | Payer: Medicaid Other | Attending: Emergency Medicine | Admitting: Emergency Medicine

## 2019-05-28 ENCOUNTER — Other Ambulatory Visit: Payer: Self-pay

## 2019-05-28 DIAGNOSIS — F17210 Nicotine dependence, cigarettes, uncomplicated: Secondary | ICD-10-CM | POA: Diagnosis not present

## 2019-05-28 DIAGNOSIS — Y93G1 Activity, food preparation and clean up: Secondary | ICD-10-CM | POA: Insufficient documentation

## 2019-05-28 DIAGNOSIS — W25XXXA Contact with sharp glass, initial encounter: Secondary | ICD-10-CM | POA: Insufficient documentation

## 2019-05-28 DIAGNOSIS — Y92000 Kitchen of unspecified non-institutional (private) residence as  the place of occurrence of the external cause: Secondary | ICD-10-CM | POA: Insufficient documentation

## 2019-05-28 DIAGNOSIS — Z79899 Other long term (current) drug therapy: Secondary | ICD-10-CM | POA: Diagnosis not present

## 2019-05-28 DIAGNOSIS — S61411A Laceration without foreign body of right hand, initial encounter: Secondary | ICD-10-CM | POA: Diagnosis not present

## 2019-05-28 DIAGNOSIS — Y999 Unspecified external cause status: Secondary | ICD-10-CM | POA: Diagnosis not present

## 2019-05-28 DIAGNOSIS — S6991XA Unspecified injury of right wrist, hand and finger(s), initial encounter: Secondary | ICD-10-CM | POA: Diagnosis present

## 2019-05-28 MED ORDER — LIDOCAINE HCL (PF) 1 % IJ SOLN
5.0000 mL | Freq: Once | INTRAMUSCULAR | Status: AC
  Administered 2019-05-28: 5 mL
  Filled 2019-05-28: qty 5

## 2019-05-28 NOTE — ED Triage Notes (Signed)
Pt was washing dishes when glass broke and cut R hand on outside at base of pinky. Bleeding under control at this time. Tetanus shot UTD

## 2019-05-28 NOTE — Discharge Instructions (Signed)
Keep the wound clean, dry, and covered. Use mild soap & water to cleanse. Follow-up with [facility] for suture removal in 10-12 days.

## 2019-05-28 NOTE — ED Notes (Signed)
Signature pad not working. Pt verbalizes understanding of d/c instructions. Denies further questions or concerns at this time  

## 2019-05-28 NOTE — ED Provider Notes (Signed)
[facility] Emergency Department Provider Note ____________________________________________  Time seen: 2000  I have reviewed the triage vital signs and the nursing notes.  HISTORY  Chief Complaint  Laceration  HPI Dorothy Nguyen is a 24 y.o. female presents himself to the ED for evaluation of an accidental laceration to the side of the right hand.  Patient was at home cleaning dishes, when she inadvertently cut the palm at the base of the pinky on a drinking glass.  She reports a current exercise and a denies any other injury at this time.  Patient reports normal range of the hand and some intermittent paresthesias.   Past Medical History:  Diagnosis Date  . Endometriosis   . Headache     Patient Active Problem List   Diagnosis Date Noted  . Chronic pelvic pain in female 08/30/2014    Past Surgical History:  Procedure Laterality Date  . LAPAROSCOPY N/A 08/30/2014   Procedure: LAPAROSCOPY DIAGNOSTIC;  Surgeon: Will Bonnet, MD;  Location: [facility] ORS;  Service: Gynecology;  Laterality: N/A;  . TYMPANOSTOMY TUBE PLACEMENT     2 times    Prior to Admission medications   Medication Sig Start Date End Date Taking? Authorizing Provider  cyclobenzaprine (FLEXERIL) 10 MG tablet Take 1 tablet (10 mg total) by mouth at bedtime. to relax your jaw muscles at night 03/13/19   Enrique Sack, FNP  ibuprofen (ADVIL) 800 MG tablet Take 1 tablet (800 mg total) by mouth every 8 (eight) hours as needed. 03/13/19   Enrique Sack, FNP  ipratropium (ATROVENT) 0.06 % nasal spray Place 2 sprays into both nostrils 4 (four) times daily as needed for rhinitis. 04/20/18 01/05/19  Coral Spikes, DO    Allergies Drug [tape]  Family History  Problem Relation Age of Onset  . Healthy Mother   . Healthy Father     Social History Social History   Tobacco Use  . Smoking status: Current Every Day Smoker    Packs/day: 0.50    Types: Cigarettes  . Smokeless tobacco:  Never Used  Substance Use Topics  . Alcohol use: Yes  . Drug use: No    Review of Systems  Constitutional: Negative for fever. Cardiovascular: Negative for chest pain. Respiratory: Negative for shortness of breath. Musculoskeletal: Negative for back pain. Skin: Negative for rash.  Right pinky/hand laceration as above. Neurological: Negative for headaches, focal weakness or numbness. ____________________________________________  PHYSICAL EXAM:  VITAL SIGNS: ED Triage Vitals [05/28/19 1813]  Enc Vitals Group     BP 123/67     Pulse Rate (!) 102     Resp 16     Temp 98.2 F (36.8 C)     Temp Source Oral     SpO2 100 %     Weight 175 lb (79.4 kg)     Height 5\' 5"  (1.651 m)     Head Circumference      Peak Flow      Pain Score 1     Pain Loc      Pain Edu?      Excl. in [place]?     Constitutional: Alert and oriented. Well appearing and in no distress. Head: Normocephalic and atraumatic. Eyes: Conjunctivae are normal. Normal extraocular movements Cardiovascular: Normal rate, regular rhythm. Normal distal pulses. Respiratory: Normal respiratory effort. Musculoskeletal: Normal composite fist on the right.  Right hand with a 2 cm laceration along the lateral aspect of the fifth digit at the MCP.  Nontender with normal  range of motion in all extremities.  Neurologic:  Normal gross sensation.  Normal intrinsic and opposition testing noted.  Normal speech and language. No gross focal neurologic deficits are appreciated. Skin:  Skin is warm, dry and intact. No rash noted. ___________________________________________  PROCEDURES  .Marland KitchenLaceration Repair  Date/Time: 05/28/2019 8:09 PM Performed by: Lissa Hoard, PA-C Authorized by: Lissa Hoard, PA-C   Consent:    Consent obtained:  Verbal   Consent given by:  Patient   Risks discussed:  Pain and poor wound healing Anesthesia (see MAR for exact dosages):    Anesthesia method:  Local infiltration   Local  anesthetic:  Lidocaine 1% w/o epi Laceration details:    Location:  Hand   Hand location:  R palm   Length (cm):  2   Depth (mm):  4 Repair type:    Repair type:  Simple Pre-procedure details:    Preparation:  Patient was prepped and draped in usual sterile fashion Treatment:    Area cleansed with:  Betadine and saline   Amount of cleaning:  Standard   Irrigation method:  Syringe Skin repair:    Repair method:  Sutures   Suture size:  5-0   Suture material:  Nylon   Suture technique:  Simple interrupted   Number of sutures:  5 Approximation:    Approximation:  Close Post-procedure details:    Dressing:  Non-adherent dressing   Patient tolerance of procedure:  Tolerated well, no immediate complications   ____________________________________________  INITIAL IMPRESSION / ASSESSMENT AND PLAN / ED COURSE  Patient ED evaluation management of an accidental laceration to the ulnar aspect of the right hand.  Patient wound is repaired using sutures.  Patient is discharged with wound care instructions and supplies.  She will follow-up with [facility] for suture removal in 10 to 12 days.  Dorothy Nguyen was evaluated in Emergency Department on 05/28/2019 for the symptoms described in the history of present illness. She was evaluated in the context of the global COVID-19 pandemic, which necessitated consideration that the patient might be at risk for infection with the SARS-CoV-2 virus that causes COVID-19. Institutional protocols and algorithms that pertain to the evaluation of patients at risk for COVID-19 are in a state of rapid change based on information released by regulatory bodies including the CDC and federal and state organizations. These policies and algorithms were followed during the patient's care in the ED. ____________________________________________  FINAL CLINICAL IMPRESSION(S) / ED DIAGNOSES  Final diagnoses:  Laceration of right hand without foreign body,  initial encounter      Lissa Hoard, PA-C 05/28/19 2053    Charlynne Pander, MD 05/28/19 [phone]

## 2019-11-14 ENCOUNTER — Ambulatory Visit (INDEPENDENT_AMBULATORY_CARE_PROVIDER_SITE_OTHER): Payer: Medicaid Other

## 2019-11-14 ENCOUNTER — Ambulatory Visit
Admission: RE | Admit: 2019-11-14 | Discharge: 2019-11-14 | Disposition: A | Discharge status: Home / Self Care | Payer: Medicaid Other | Source: Ambulatory Visit | Attending: Family Medicine | Admitting: Family Medicine

## 2019-11-14 ENCOUNTER — Other Ambulatory Visit: Payer: Self-pay

## 2019-11-14 VITALS — BP 101/70 | HR 90 | Temp 98.5°F | Resp 18 | Ht 65.0 in | Wt 150.0 lb

## 2019-11-14 DIAGNOSIS — S99921A Unspecified injury of right foot, initial encounter: Secondary | ICD-10-CM

## 2019-11-14 DIAGNOSIS — S99911A Unspecified injury of right ankle, initial encounter: Secondary | ICD-10-CM

## 2019-11-14 DIAGNOSIS — S9031XA Contusion of right foot, initial encounter: Secondary | ICD-10-CM | POA: Diagnosis not present

## 2019-11-14 MED ORDER — MELOXICAM 15 MG PO TABS: 15.0000 mg | ORAL_TABLET | Freq: Every day | ORAL | 0 refills | Status: DC | PRN
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
## 2019-11-14 NOTE — Discharge Instructions (Signed)
Rest, ice, elevation.  Medication as needed.  Take care  Dr. Westly Hinnant  
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
## 2019-12-15 ENCOUNTER — Other Ambulatory Visit: Payer: Self-pay

## 2019-12-15 ENCOUNTER — Emergency Department
Admission: EM | Admit: 2019-12-15 | Discharge: 2019-12-15 | Disposition: A | Discharge status: Home / Self Care | Payer: BC Managed Care – PPO | Attending: Emergency Medicine | Admitting: Emergency Medicine

## 2019-12-15 ENCOUNTER — Emergency Department: Payer: BC Managed Care – PPO

## 2019-12-15 ENCOUNTER — Encounter: Payer: Self-pay | Admitting: Emergency Medicine

## 2019-12-15 DIAGNOSIS — F17210 Nicotine dependence, cigarettes, uncomplicated: Secondary | ICD-10-CM | POA: Diagnosis not present

## 2019-12-15 DIAGNOSIS — S52501A Unspecified fracture of the lower end of right radius, initial encounter for closed fracture: Secondary | ICD-10-CM

## 2019-12-15 DIAGNOSIS — S6991XA Unspecified injury of right wrist, hand and finger(s), initial encounter: Secondary | ICD-10-CM | POA: Diagnosis present

## 2019-12-15 MED ORDER — HYDROCODONE-ACETAMINOPHEN 5-325 MG PO TABS: 1.0000 | ORAL_TABLET | Freq: Four times a day (QID) | ORAL | 0 refills | Status: DC | PRN

## 2019-12-15 MED ORDER — ONDANSETRON 4 MG PO TBDP: 4.0000 mg | ORAL_TABLET | Freq: Three times a day (TID) | ORAL | 0 refills | Status: DC | PRN

## 2019-12-15 MED ORDER — HYDROCODONE-ACETAMINOPHEN 5-325 MG PO TABS
1.0000 | ORAL_TABLET | Freq: Once | ORAL | Status: AC
  Administered 2019-12-15: 1 via ORAL
  Filled 2019-12-15: qty 1

## 2019-12-15 MED ORDER — ONDANSETRON 4 MG PO TBDP
4.0000 mg | ORAL_TABLET | Freq: Once | ORAL | Status: AC
  Administered 2019-12-15: 4 mg via ORAL
  Filled 2019-12-15: qty 1
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
## 2019-12-15 NOTE — Discharge Instructions (Addendum)
Call make an appointment with Dr. Joice Lofts who is on-call for orthopedics.  His contact information is listed on your discharge papers.  Ice and elevation.  Leave the splint on until seen by the orthopedist.  Pain medication was sent to your pharmacy.  Hydrocodone is a narcotic and to be taken every 6 hours as needed for pain.  Do not drive or operate machinery while taking this medication as it can make you drowsy.  Zofran if needed for nausea.  You may take ibuprofen with this medication if additional pain medication is needed.

## 2019-12-15 NOTE — ED Notes (Signed)
Pt sitting calmly in chair. Mother at bedside. NT to apply splint and sling soon.

## 2020-02-13 ENCOUNTER — Ambulatory Visit (INDEPENDENT_AMBULATORY_CARE_PROVIDER_SITE_OTHER): Payer: BC Managed Care – PPO

## 2020-02-13 ENCOUNTER — Encounter: Payer: Self-pay | Admitting: Emergency Medicine

## 2020-02-13 ENCOUNTER — Other Ambulatory Visit: Payer: Self-pay

## 2020-02-13 ENCOUNTER — Ambulatory Visit
Admission: EM | Admit: 2020-02-13 | Discharge: 2020-02-13 | Disposition: A | Discharge status: Home / Self Care | Payer: BC Managed Care – PPO | Attending: Family Medicine | Admitting: Family Medicine

## 2020-02-13 DIAGNOSIS — R4182 Altered mental status, unspecified: Secondary | ICD-10-CM | POA: Diagnosis not present

## 2020-02-13 DIAGNOSIS — R0789 Other chest pain: Secondary | ICD-10-CM | POA: Diagnosis not present

## 2020-02-13 DIAGNOSIS — Z888 Allergy status to other drugs, medicaments and biological substances status: Secondary | ICD-10-CM | POA: Diagnosis not present

## 2020-02-13 DIAGNOSIS — R5383 Other fatigue: Secondary | ICD-10-CM

## 2020-02-13 DIAGNOSIS — R002 Palpitations: Secondary | ICD-10-CM | POA: Insufficient documentation

## 2020-02-13 DIAGNOSIS — Z87891 Personal history of nicotine dependence: Secondary | ICD-10-CM | POA: Insufficient documentation

## 2020-02-13 DIAGNOSIS — R0602 Shortness of breath: Secondary | ICD-10-CM

## 2020-02-13 DIAGNOSIS — Z20822 Contact with and (suspected) exposure to covid-19: Secondary | ICD-10-CM | POA: Diagnosis not present

## 2020-02-13 LAB — CBC WITH DIFFERENTIAL/PLATELET
Abs Immature Granulocytes: 0.02 10*3/uL (ref 0.00–0.07)
Basophils Absolute: 0.1 10*3/uL (ref 0.0–0.1)
Basophils Relative: 1 %
Eosinophils Absolute: 0 10*3/uL (ref 0.0–0.5)
Eosinophils Relative: 1 %
HCT: 38.6 % (ref 36.0–46.0)
Hemoglobin: 13.7 g/dL (ref 12.0–15.0)
Immature Granulocytes: 0 %
Lymphocytes Relative: 40 %
Lymphs Abs: 2.4 10*3/uL (ref 0.7–4.0)
MCH: 28.9 pg (ref 26.0–34.0)
MCHC: 35.5 g/dL (ref 30.0–36.0)
MCV: 81.4 fL (ref 80.0–100.0)
Monocytes Absolute: 0.5 10*3/uL (ref 0.1–1.0)
Monocytes Relative: 9 %
Neutro Abs: 3 10*3/uL (ref 1.7–7.7)
Neutrophils Relative %: 49 %
Platelets: 256 10*3/uL (ref 150–400)
RBC: 4.74 MIL/uL (ref 3.87–5.11)
RDW: 12.4 % (ref 11.5–15.5)
WBC: 6 10*3/uL (ref 4.0–10.5)
nRBC: 0 % (ref 0.0–0.2)

## 2020-02-13 LAB — GLUCOSE, CAPILLARY: Glucose-Capillary: 83 mg/dL (ref 70–99)

## 2020-02-13 LAB — COMPREHENSIVE METABOLIC PANEL
ALT: 25 U/L (ref 0–44)
AST: 18 U/L (ref 15–41)
Albumin: 4.2 g/dL (ref 3.5–5.0)
Alkaline Phosphatase: 62 U/L (ref 38–126)
Anion gap: 7 (ref 5–15)
BUN: 8 mg/dL (ref 6–20)
CO2: 26 mmol/L (ref 22–32)
Calcium: 9 mg/dL (ref 8.9–10.3)
Chloride: 105 mmol/L (ref 98–111)
Creatinine, Ser: 0.72 mg/dL (ref 0.44–1.00)
GFR, Estimated: >60 mL/min (ref >60)
Glucose, Bld: 100 mg/dL — ABNORMAL HIGH (ref 70–99)
Potassium: 4.3 mmol/L (ref 3.5–5.1)
Sodium: 138 mmol/L (ref 135–145)
Total Bilirubin: 0.5 mg/dL (ref 0.3–1.2)
Total Protein: 7.5 g/dL (ref 6.5–8.1)

## 2020-02-13 NOTE — ED Triage Notes (Signed)
Patient states since Saturday she has been having an irregular heartbeat and feeling like she can't take a deep breath. She also states she has been having confusion and describes this feeling like she "can't get a grip of reality."

## 2020-02-13 NOTE — ED Provider Notes (Addendum)
[facility]    CSN: [id number] Arrival date & time: 02/13/20  1333  History   Chief Complaint Chief Complaint  Patient presents with  . Irregular Heart Beat  . Altered Mental Status   HPI   24 year old female presents with multiple complaints.  Patient states that she has not been feeling well since Saturday.  She states that she recently had a cold.  She had negative Covid testing on 12/1.  Patient reports that since Saturday she has been experiencing chest tightness, palpitations, nausea, diarrhea, hot flashes, feeling shaky.  She also states that she had a cough.  She states that she feels like she "cannot get a grip on reality".  When asked to further explain that she states that she seems to be "out of it".  No documented fever.  No known relieving factors.  No known exacerbating factors.  Past Medical History:  Diagnosis Date  . Endometriosis   . Headache     Patient Active Problem List   Diagnosis Date Noted  . Chronic pelvic pain in female 08/30/2014    Past Surgical History:  Procedure Laterality Date  . LAPAROSCOPY N/A 08/30/2014   Procedure: LAPAROSCOPY DIAGNOSTIC;  Surgeon: Conard Novak, MD;  Location: [facility] ORS;  Service: Gynecology;  Laterality: N/A;  . TYMPANOSTOMY TUBE PLACEMENT     2 times    OB History   No obstetric history on file.      Home Medications    Prior to Admission medications   Medication Sig Start Date End Date Taking? Authorizing Provider  ipratropium (ATROVENT) 0.06 % nasal spray Place 2 sprays into both nostrils 4 (four) times daily as needed for rhinitis. 04/20/18 01/05/19  Tommie Sams, DO    Family History Family History  Problem Relation Age of Onset  . Healthy Mother   . Healthy Father     Social History Social History   Tobacco Use  . Smoking status: Former Smoker    Packs/day: 0.50    Types: Cigarettes  . Smokeless tobacco: Never Used  Vaping Use  . Vaping Use: Never used  Substance Use Topics   . Alcohol use: Not Currently  . Drug use: No     Allergies   Drug [tape]   Review of Systems Review of Systems Per HPI  Physical Exam Triage Vital Signs ED Triage Vitals  Enc Vitals Group     BP 02/13/20 1344 114/80     Pulse Rate 02/13/20 1344 (!) 102     Resp 02/13/20 1344 18     Temp 02/13/20 1344 99 F (37.2 C)     Temp Source 02/13/20 1344 Oral     SpO2 02/13/20 1344 100 %     Weight 02/13/20 1342 160 lb (72.6 kg)     Height 02/13/20 1342 5\' 5"  (1.651 m)     Head Circumference --      Peak Flow --      Pain Score 02/13/20 1342 0     Pain Loc --      Pain Edu? --      Excl. in GC? --    Updated Vital Signs BP 114/80 (BP Location: Right Arm)   Pulse (!) 102   Temp 99 F (37.2 C) (Oral)   Resp 18   Ht 5\' 5"  (1.651 m)   Wt 72.6 kg   LMP 02/06/2020   SpO2 100%   BMI 26.63 kg/m   Visual Acuity Right Eye Distance:   Left  Eye Distance:   Bilateral Distance:    Right Eye Near:   Left Eye Near:    Bilateral Near:     Physical Exam Vitals and nursing note reviewed.  Constitutional:      General: She is not in acute distress.    Appearance: Normal appearance. She is not ill-appearing.  HENT:     Head: Normocephalic and atraumatic.  Eyes:     General:        Right eye: No discharge.        Left eye: No discharge.     Conjunctiva/sclera: Conjunctivae normal.  Cardiovascular:     Rate and Rhythm: Normal rate and regular rhythm.     Heart sounds: No murmur heard.   Pulmonary:     Effort: Pulmonary effort is normal. No respiratory distress.     Breath sounds: Normal breath sounds. No wheezing or rales.  Neurological:     Mental Status: She is alert.  Psychiatric:        Behavior: Behavior normal.     Comments: Flat affect.     UC Treatments / Results  Labs (all labs ordered are listed, but only abnormal results are displayed) Labs Reviewed  COMPREHENSIVE METABOLIC PANEL - Abnormal; Notable for the following components:      Result Value    Glucose, Bld 100 (*)    All other components within normal limits  SARS CORONAVIRUS 2 (TAT 6-24 HRS)  GLUCOSE, CAPILLARY  CBC WITH DIFFERENTIAL/PLATELET  CBG MONITORING, ED    EKG Interpretation: Normal sinus rhythm with sinus arrhythmia with a rate of 81.  Normal axis.  No ST-T wave changes.  Normal EKG.  Radiology DG Chest 2 View  Result Date: 02/13/2020 CLINICAL DATA:  24 year old female with shortness of breath, irregular heartbeat. EXAM: CHEST - 2 VIEW COMPARISON:  Thoracic spine radiographs 03/03/2011. FINDINGS: The heart size and mediastinal contours are within normal limits. Normal lung volumes. Both lungs are clear. No pneumothorax or pleural effusion. Visualized tracheal air column is within normal limits. No osseous abnormality identified. Paucity of bowel gas in the upper abdomen. IMPRESSION: Negative.  No cardiopulmonary abnormality. Electronically Signed   By: Odessa Fleming M.D.   On: 02/13/2020 15:03    Procedures Procedures (including critical care time)  Medications Ordered in UC Medications - No data to display  Initial Impression / Assessment and Plan / UC Course  I have reviewed the triage vital signs and the nursing notes.  Pertinent labs & imaging results that were available during my care of the patient were reviewed by me and considered in my medical decision making (see chart for details).    24 year old female presents with multiple complaints.  Laboratory studies unremarkable.  Chest x-ray was obtained and was independently reviewed by me.  Interpretation: Normal chest x-ray.  EKG normal.  Advised supportive care and follow-up with PCP.  Suspect that depression and/or anxiety is playing a role.  Final Clinical Impressions(s) / UC Diagnoses   Final diagnoses:  Palpitations  Fatigue, unspecified type     Discharge Instructions     Labs normal.  Chest xray normal.  Awaiting COVID test results.  Rest, fluids. Follow up with your PCP.  Take care  Dr.  Adriana Simas    ED Prescriptions    None     PDMP not reviewed this encounter.      Tommie Sams, [place] 02/13/20 1630

## 2020-02-13 NOTE — Discharge Instructions (Signed)
Labs normal.  Chest xray normal.  Awaiting COVID test results.  Rest, fluids. Follow up with your PCP.  Take care  Dr. Adriana Simas

## 2020-02-14 LAB — SARS CORONAVIRUS 2 (TAT 6-24 HRS): SARS Coronavirus 2: NEGATIVE

## 2020-07-22 ENCOUNTER — Other Ambulatory Visit: Payer: Self-pay | Admitting: Physical Medicine & Rehabilitation

## 2020-07-22 DIAGNOSIS — M5414 Radiculopathy, thoracic region: Secondary | ICD-10-CM

## 2020-07-22 DIAGNOSIS — M542 Cervicalgia: Secondary | ICD-10-CM

## 2020-07-22 DIAGNOSIS — M546 Pain in thoracic spine: Secondary | ICD-10-CM

## 2020-07-28 ENCOUNTER — Ambulatory Visit: Payer: Medicaid Other

## 2020-08-27 ENCOUNTER — Other Ambulatory Visit: Payer: Self-pay | Admitting: Orthopedic Surgery

## 2020-08-27 DIAGNOSIS — M25531 Pain in right wrist: Secondary | ICD-10-CM

## 2020-08-27 DIAGNOSIS — M25331 Other instability, right wrist: Secondary | ICD-10-CM

## 2020-08-27 DIAGNOSIS — S52354D Nondisplaced comminuted fracture of shaft of radius, right arm, subsequent encounter for closed fracture with routine healing: Secondary | ICD-10-CM

## 2020-09-08 ENCOUNTER — Ambulatory Visit
Admission: RE | Admit: 2020-09-08 | Discharge: 2020-09-08 | Disposition: A | Discharge status: Home / Self Care | Payer: Medicaid Other | Source: Ambulatory Visit | Attending: Orthopedic Surgery | Admitting: Orthopedic Surgery

## 2020-09-08 DIAGNOSIS — M25531 Pain in right wrist: Secondary | ICD-10-CM

## 2020-09-08 DIAGNOSIS — M25331 Other instability, right wrist: Secondary | ICD-10-CM

## 2020-09-08 DIAGNOSIS — S52354D Nondisplaced comminuted fracture of shaft of radius, right arm, subsequent encounter for closed fracture with routine healing: Secondary | ICD-10-CM

## 2020-12-25 ENCOUNTER — Ambulatory Visit
Admission: RE | Admit: 2020-12-25 | Discharge: 2020-12-25 | Disposition: A | Discharge status: Home / Self Care | Payer: Medicaid Other | Source: Ambulatory Visit | Attending: Physician Assistant | Admitting: Physician Assistant

## 2020-12-25 ENCOUNTER — Other Ambulatory Visit: Payer: Self-pay

## 2020-12-25 VITALS — BP 100/78 | HR 97 | Temp 98.6°F | Resp 16

## 2020-12-25 DIAGNOSIS — R051 Acute cough: Secondary | ICD-10-CM | POA: Diagnosis not present

## 2020-12-25 DIAGNOSIS — J40 Bronchitis, not specified as acute or chronic: Secondary | ICD-10-CM | POA: Diagnosis not present

## 2020-12-25 MED ORDER — PREDNISONE 20 MG PO TABS: 40.0000 mg | ORAL_TABLET | Freq: Every day | ORAL | 0 refills | Status: AC

## 2020-12-25 MED ORDER — PROMETHAZINE-DM 6.25-15 MG/5ML PO SYRP: 5.0000 mL | ORAL_SOLUTION | Freq: Four times a day (QID) | ORAL | 0 refills | Status: DC | PRN

## 2020-12-25 MED ORDER — ALBUTEROL SULFATE HFA 108 (90 BASE) MCG/ACT IN AERS
1.0000 | INHALATION_SPRAY | Freq: Four times a day (QID) | RESPIRATORY_TRACT | 0 refills | Status: DC | PRN
Start: 2020-12-25 — End: 2021-01-26

## 2020-12-25 NOTE — Discharge Instructions (Addendum)
-  You have bronchitis which is a chest cold.  This can last for couple of weeks sometimes.  I have sent prednisone and albuterol to help with the wheezing and open up your chest.  I have also sent a cough medication for you to take.  Make sure to increase your fluid intake. -If you develop a fever, chest pain or shortness of breath you should be seen again. -If you are not any better after the next 7 to 10 days, please follow with primary care provider. -Go to emergency department for any acute worsening symptoms, especially her breathing.

## 2020-12-25 NOTE — ED Provider Notes (Signed)
[facility]    CSN: [id number] Arrival date & time: 12/25/20  0901      History   Chief Complaint Chief Complaint  Patient presents with   Nasal Congestion   Cough    HPI Dorothy Nguyen is a 25 y.o. female presenting for approximately 1 week history of cough and congestion.  She says the cough is nonproductive.  Patient says her nasal congestion has improved but she believes the cough is gotten worse.  She denies any associated fever.  Denies chest pain but says it feels a little tight and she has wheezing.  She has no history of asthma or other lung disease.  Patient reports 2 of her children are ill with similar symptoms were recently diagnosed with sinus infections.  She says the children's doctor advised that she get checked out for possible bronchitis given her symptoms.  She says she would have thought much of that until a doctor advised her to get checked out.  She has not been taking any OTC meds.  She denies any known exposure to COVID-19 but says her children were not tested to her knowledge.  No other complaints.  HPI  Past Medical History:  Diagnosis Date   Endometriosis    Headache     Patient Active Problem List   Diagnosis Date Noted   Chronic pelvic pain in female 08/30/2014    Past Surgical History:  Procedure Laterality Date   LAPAROSCOPY N/A 08/30/2014   Procedure: LAPAROSCOPY DIAGNOSTIC;  Surgeon: Conard Novak, MD;  Location: [facility] ORS;  Service: Gynecology;  Laterality: N/A;   TYMPANOSTOMY TUBE PLACEMENT     2 times    OB History   No obstetric history on file.      Home Medications    Prior to Admission medications   Medication Sig Start Date End Date Taking? Authorizing Provider  albuterol (VENTOLIN HFA) 108 (90 Base) MCG/ACT inhaler Inhale 1-2 puffs into the lungs every 6 (six) hours as needed for wheezing or shortness of breath. 12/25/20  Yes Shirlee Latch, PA-C  predniSONE (DELTASONE) 20 MG tablet Take 2 tablets (40  mg total) by mouth daily for 5 days. 12/25/20 12/30/20 Yes Shirlee Latch, PA-C  promethazine-dextromethorphan (PROMETHAZINE-DM) 6.25-15 MG/5ML syrup Take 5 mLs by mouth 4 (four) times daily as needed for cough. 12/25/20  Yes Eusebio Friendly B, PA-C  ipratropium (ATROVENT) 0.06 % nasal spray Place 2 sprays into both nostrils 4 (four) times daily as needed for rhinitis. 04/20/18 01/05/19  Tommie Sams, DO    Family History Family History  Problem Relation Age of Onset   Healthy Mother    Healthy Father     Social History Social History   Tobacco Use   Smoking status: Former    Packs/day: 0.50    Types: Cigarettes   Smokeless tobacco: Never  Vaping Use   Vaping Use: Never used  Substance Use Topics   Alcohol use: Not Currently   Drug use: No     Allergies   Hydrocodone, Doxycycline, and Drug [tape]   Review of Systems Review of Systems  Constitutional:  Positive for fatigue. Negative for chills, diaphoresis and fever.  HENT:  Positive for congestion and rhinorrhea. Negative for ear pain, sinus pressure, sinus pain and sore throat.   Respiratory:  Positive for cough, chest tightness and wheezing. Negative for shortness of breath.   Cardiovascular:  Negative for chest pain.  Gastrointestinal:  Negative for abdominal pain, nausea and vomiting.  Musculoskeletal:  Negative for arthralgias and myalgias.  Skin:  Negative for rash.  Neurological:  Negative for weakness and headaches.  Hematological:  Negative for adenopathy.    Physical Exam Triage Vital Signs ED Triage Vitals  Enc Vitals Group     BP 12/25/20 0912 100/78     Pulse Rate 12/25/20 0912 97     Resp 12/25/20 0912 16     Temp 12/25/20 0912 98.6 F (37 C)     Temp Source 12/25/20 0912 Oral     SpO2 12/25/20 0912 99 %     Weight --      Height --      Head Circumference --      Peak Flow --      Pain Score 12/25/20 0910 2     Pain Loc --      Pain Edu? --      Excl. in GC? --    No data  found.  Updated Vital Signs BP 100/78   Pulse 97   Temp 98.6 F (37 C) (Oral)   Resp 16   LMP 12/20/2020   SpO2 99%     Physical Exam Vitals and nursing note reviewed.  Constitutional:      General: She is not in acute distress.    Appearance: Normal appearance. She is not ill-appearing or toxic-appearing.  HENT:     Head: Normocephalic and atraumatic.     Nose: Congestion present.     Mouth/Throat:     Mouth: Mucous membranes are moist.     Pharynx: Oropharynx is clear.  Eyes:     General: No scleral icterus.       Right eye: No discharge.        Left eye: No discharge.     Conjunctiva/sclera: Conjunctivae normal.  Cardiovascular:     Rate and Rhythm: Normal rate and regular rhythm.     Heart sounds: Normal heart sounds.  Pulmonary:     Effort: Pulmonary effort is normal. No respiratory distress.     Breath sounds: Wheezing (diffuse mild wheezing throughout) present.  Musculoskeletal:     Cervical back: Neck supple.  Skin:    General: Skin is dry.  Neurological:     General: No focal deficit present.     Mental Status: She is alert. Mental status is at baseline.     Motor: No weakness.     Gait: Gait normal.  Psychiatric:        Mood and Affect: Mood normal.        Behavior: Behavior normal.        Thought Content: Thought content normal.     UC Treatments / Results  Labs (all labs ordered are listed, but only abnormal results are displayed) Labs Reviewed - No data to display  EKG   Radiology No results found.  Procedures Procedures (including critical care time)  Medications Ordered in UC Medications - No data to display  Initial Impression / Assessment and Plan / UC Course  I have reviewed the triage vital signs and the nursing notes.  Pertinent labs & imaging results that were available during my care of the patient were reviewed by me and considered in my medical decision making (see chart for details).  52 old female presenting for 1 week  history of nasal congestion and cough.  Patient reports worsening of the cough and wheezing.  Vitals are stable.  She is afebrile and oxygen is 99%.  Exam does reveal nasal  congestion and mild diffuse wheezing throughout chest.  Treating patient at this time for wheezy bronchitis with prednisone, albuterol and Promethazine DM.  Advised her to increase rest and fluids.  Advised that if she has a fever, chest pain or breathing difficulty she should be seen again and have imaging performed to rule out pneumonia but given the fact that she does not have a fever and her oxygen is normal, this is reassuring.  Also her children have similar symptoms are likely viral in nature.  Advise follow-up as needed.  Final Clinical Impressions(s) / UC Diagnoses   Final diagnoses:  Wheezy bronchitis  Acute cough     Discharge Instructions      -You have bronchitis which is a chest cold.  This can last for couple of weeks sometimes.  I have sent prednisone and albuterol to help with the wheezing and open up your chest.  I have also sent a cough medication for you to take.  Make sure to increase your fluid intake. -If you develop a fever, chest pain or shortness of breath you should be seen again. -If you are not any better after the next 7 to 10 days, please follow with primary care provider. -Go to emergency department for any acute worsening symptoms, especially her breathing.     ED Prescriptions     Medication Sig Dispense Auth. Provider   predniSONE (DELTASONE) 20 MG tablet Take 2 tablets (40 mg total) by mouth daily for 5 days. 10 tablet Eusebio Friendly B, PA-C   albuterol (VENTOLIN HFA) 108 (90 Base) MCG/ACT inhaler Inhale 1-2 puffs into the lungs every 6 (six) hours as needed for wheezing or shortness of breath. 1 g Shirlee Latch, PA-C   promethazine-dextromethorphan (PROMETHAZINE-DM) 6.25-15 MG/5ML syrup Take 5 mLs by mouth 4 (four) times daily as needed for cough. 118 mL Shirlee Latch, PA-C       PDMP not reviewed this encounter.   Shirlee Latch, PA-C 12/25/20 1006

## 2020-12-25 NOTE — ED Triage Notes (Signed)
PT reports congestion and cough for 1 week.

## 2021-01-26 ENCOUNTER — Ambulatory Visit
Admission: EM | Admit: 2021-01-26 | Discharge: 2021-01-26 | Disposition: A | Discharge status: Home / Self Care | Payer: BC Managed Care – PPO | Attending: Emergency Medicine | Admitting: Emergency Medicine

## 2021-01-26 DIAGNOSIS — H9201 Otalgia, right ear: Secondary | ICD-10-CM | POA: Diagnosis not present

## 2021-01-26 DIAGNOSIS — M26621 Arthralgia of right temporomandibular joint: Secondary | ICD-10-CM | POA: Diagnosis not present

## 2021-01-26 DIAGNOSIS — J069 Acute upper respiratory infection, unspecified: Secondary | ICD-10-CM

## 2021-01-26 MED ORDER — NAPROXEN 500 MG PO TABS: 500.0000 mg | ORAL_TABLET | Freq: Two times a day (BID) | ORAL | 0 refills | Status: DC

## 2021-01-26 MED ORDER — CYCLOBENZAPRINE HCL 10 MG PO TABS: 10.0000 mg | ORAL_TABLET | Freq: Every day | ORAL | 0 refills | Status: DC

## 2021-01-26 MED ORDER — FLUTICASONE PROPIONATE 50 MCG/ACT NA SUSP: 2.0000 | Freq: Every day | NASAL | 0 refills | Status: DC

## 2021-01-26 MED ORDER — PREDNISONE 20 MG PO TABS: 40.0000 mg | ORAL_TABLET | Freq: Every day | ORAL | 0 refills | Status: AC

## 2021-01-26 NOTE — ED Provider Notes (Signed)
HPI  SUBJECTIVE:  Dorothy Nguyen is a 25 y.o. female who presents with nasal congestion, right-sided sinus pain and pressure for the past 5 days.  She reports muffled hearing/right ear pain starting today.  She reports ear popping when she yawns or chews.  She has been clenching her jaw for the past week.  She had dental work last week and had her mouth open for prolonged period of time.  No otorrhea, foreign body insertion.  She does not grind her teeth.  No antibiotics in the past month.  She took ibuprofen within 6 hours of evaluation.  She has tried ibuprofen 400 mg and Xyzal.  The ibuprofen helps.  Symptoms worse at night.  She has a past medical history of allergies, but states that they are not bothering her.  LMP: Last week.  Denies possibility being pregnant.  PMD: None   Past Medical History:  Diagnosis Date   Endometriosis    Headache     Past Surgical History:  Procedure Laterality Date   LAPAROSCOPY N/A 08/30/2014   Procedure: LAPAROSCOPY DIAGNOSTIC;  Surgeon: Conard Novak, MD;  Location: [facility];  Service: Gynecology;  Laterality: N/A;   TYMPANOSTOMY TUBE PLACEMENT     2 times    Family History  Problem Relation Age of Onset   Healthy Mother    Healthy Father     Social History   Tobacco Use   Smoking status: Former    Packs/day: 0.50    Types: Cigarettes   Smokeless tobacco: Never  Vaping Use   Vaping Use: Never used  Substance Use Topics   Alcohol use: Not Currently   Drug use: No    No current facility-administered medications for this encounter.  Current Outpatient Medications:    cyclobenzaprine (FLEXERIL) 10 MG tablet, Take 1 tablet (10 mg total) by mouth at bedtime., Disp: 20 tablet, Rfl: 0   fluticasone (FLONASE) 50 MCG/ACT nasal spray, Place 2 sprays into both nostrils daily., Disp: 16 g, Rfl: 0   naproxen (NAPROSYN) 500 MG tablet, Take 1 tablet (500 mg total) by mouth 2 (two) times daily., Disp: 20 tablet, Rfl: 0   predniSONE (DELTASONE)  20 MG tablet, Take 2 tablets (40 mg total) by mouth daily with breakfast for 5 days., Disp: 10 tablet, Rfl: 0  Allergies  Allergen Reactions   Hydrocodone Shortness Of Breath   Doxycycline Nausea And Vomiting   Drug [Tape]      ROS  As noted in HPI.   Physical Exam  BP 109/70 (BP Location: Left Arm)   Pulse 90   Temp 98.3 F (36.8 C) (Oral)   Resp 16   Ht 5\' 5"  (1.651 m)   Wt 77.1 kg   LMP 01/19/2021   SpO2 99%   BMI 28.29 kg/m   Constitutional: Well developed, well nourished, no acute distress Eyes:  EOMI, conjunctiva normal bilaterally HENT: Normocephalic, atraumatic,mucus membranes moist.  Right external ear normal.  No pain with traction on pinna, palpation of mastoid.  Tenderness with palpation of the tragus.  EAC, TM normal.  Hearing decreased in right ear compared to left.  Positive tenderness over the right TMJ.  No crepitus.  Dentition normal, nontender.  Positive nasal congestion, worse on the right.  Positive right maxillary sinus tenderness. Respiratory: Normal inspiratory effort Cardiovascular: Normal rate GI: nondistended skin: No rash, skin intact Musculoskeletal: no deformities Neurologic: Alert & oriented x 3, no focal neuro deficits Psychiatric: Speech and behavior appropriate   ED Course  Medications - No data to display  No orders of the defined types were placed in this encounter.   No results found for this or any previous visit (from the past 24 hour(s)). No results found.  ED Clinical Impression  1. Right ear pain   2. Arthralgia of right temporomandibular joint   3. Upper respiratory tract infection, unspecified type      ED Assessment/Plan  1.  Otalgia.  Suspect TMJ arthralgia.  She just had some dental work done, had her mouth open for any prolonged period of time, and has been clenching her jaw over the past week.  will try Naprosyn, Tylenol, Flexeril, prednisone 40 mg for 5 days and a soft diet.  Follow-up with her dentist for  a bite guard.  She does not appear to have an otitis media at this time.  She states that she is prone to otitis media, so if she is not better in 5 days, she can return here for reevaluation and consideration of antibiotics if she has an otitis media.  2.  Nasal congestion/sinus tenderness. Unfortunately, patient is out of the treatment window for influenza, thus testing was not done.  She could have some eustachian tube dysfunction contributing to her otalgia.  No clear indications for antibiotics for sinus infection at this time.  We will try Mucinex D, Flonase, saline nasal irrigation.  May return here if not better in 5 days and we can consider antibiotics for sinusitis.  Discussed  MDM, treatment plan, and plan for follow-up with patient. patient agrees with plan.   Meds ordered this encounter  Medications   naproxen (NAPROSYN) 500 MG tablet    Sig: Take 1 tablet (500 mg total) by mouth 2 (two) times daily.    Dispense:  20 tablet    Refill:  0   cyclobenzaprine (FLEXERIL) 10 MG tablet    Sig: Take 1 tablet (10 mg total) by mouth at bedtime.    Dispense:  20 tablet    Refill:  0   predniSONE (DELTASONE) 20 MG tablet    Sig: Take 2 tablets (40 mg total) by mouth daily with breakfast for 5 days.    Dispense:  10 tablet    Refill:  0   fluticasone (FLONASE) 50 MCG/ACT nasal spray    Sig: Place 2 sprays into both nostrils daily.    Dispense:  16 g    Refill:  0      *This clinic note was created using Scientist, clinical (histocompatibility and immunogenetics). Therefore, there may be occasional mistakes despite careful proofreading.  ?    Domenick Gong, MD 01/27/21 [phone]

## 2021-01-26 NOTE — ED Triage Notes (Signed)
Pt here with C/O right ear pain since waking up this morning, pain in ear and neck.

## 2021-01-26 NOTE — Discharge Instructions (Addendum)
Naprosyn/1000 mg of Tylenol twice a day. Flexeril at night, prednisone 40 mg for 5 days and a soft diet for the next few days  Follow-up with your dentist for a bite guard.  Saline nasal irrigation, Mucinex D, Flonase for your nasal congestion/sinuses.  Return here in 5 days reevaluation if not better, and we can consider antibiotics at that time.  Here is a list of primary care providers who are taking new patients:  Dr. Elizabeth Sauer [address] [place] [address] [phone]  [facility] Primary Care at [facility] [address] [place], [place] [address] [phone]  [facility] [place] [address] [place] [address]  [phone]  [facility] [address] [place], [place] [address] [phone]  [facility] [address] [place]  [phone] [place], [place] [address]  Here are clinics/ other resources who will see you if you do not have insurance. Some have certain criteria that you must meet. Call them and find out what they are:  [facility]: [address], [place], [place] [address] Phone: [phone] Hours: First and Third Saturdays of each Month, 9 a.m. - 1 p.m.  [facility]: [address], [address] [place], [place] [address] Phone: [phone] Hours: Tuesday, 4 p.m. - 8 p.m. Thursday, 1 p.m. - 8 p.m. Wednesday, 9 a.m. - [facility] [address] [place], [place] [address] Phone: [phone] Pharmacy Phone Number: [phone] Dental Phone Number: [phone] [facility] Insurance Help: [phone]  Dental Hours: Monday - Thursday, 8 a.m. - 6 p.m.  [facility] [address] [place] [address], [place], [place] [address] Phone: [phone] Pharmacy Phone Number: [phone] [facility] Insurance Help: [phone]  [facility] [address] [place] [place], [place] [address] Phone: [phone] Pharmacy Phone Number: [phone] [facility] Insurance Help:  [phone]  [facility] [address] [place], [place] [address] Phone: [phone] [facility] Insurance Help: [phone]   [facility] [address] [place], [place] [address] Phone: [phone]  Go to www.goodrx.com  or www.costplusdrugs.com to look up your medications. This will give you a list of where you can find your prescriptions at the most affordable prices. Or ask the pharmacist what the cash price is, or if they have any other discount programs available to help make your medication more affordable. This can be less expensive than what you would pay with insurance.    Here is a list of primary care providers who are taking new patients:  Dr. Elizabeth Sauer [address] [place] [address] [phone]  [facility] Primary Care at [facility] [address] [place], [place] [address] [phone]  [facility] [place] [address] [place] [address]  [phone]  [facility] [address] [place], [place] [address] [phone]  [facility] [address] [place]  [phone] [place], [place] [address]  Here are clinics/ other resources who will see you if you do not have insurance. Some have certain criteria that you must meet. Call them and find out what they are:  [facility]: [address] [place], [place] [address] Phone: [phone] Hours: First and Third Saturdays of each Month, 9 a.m. - 1 p.m.  [facility]: [address] [place], [place] [address] Phone: [phone] Hours: Tuesday, 4 p.m. - 8 p.m. Thursday, 1 p.m. - 8 p.m. Wednesday, 9 a.m. - Noon  [facility] [phone]  [address], [place], [place] [address] Phone: [phone] Pharmacy Phone Number: [phone] Dental Phone Number: [phone] [facility] Insurance Help: [phone]  Dental Hours: Monday - Thursday, 8 a.m. - 6 p.m.  [facility] [address]  [place], [place] [address] Phone: [phone] Pharmacy Phone Number: [phone] [facility] Insurance Help: [phone]  [facility] [address] [place] [place], [place] [address] Phone: [phone] Pharmacy Phone Number: [phone] [facility] Insurance Help: [phone]  [facility] [address] [place], [place] [address] Phone: [phone] [facility] Insurance Help: [phone]   [facility] [address] [place], [place] [address] Phone: [phone]  Go to www.goodrx.com  or www.costplusdrugs.com to look up your medications. This will give you a list of where you can find your prescriptions at the most affordable prices. Or ask the pharmacist what the cash price is, or if they have any other discount programs available to help make your medication more affordable. This can be less expensive than what you would pay with insurance.

## 2021-03-24 ENCOUNTER — Ambulatory Visit
Admission: RE | Admit: 2021-03-24 | Discharge: 2021-03-24 | Disposition: A | Discharge status: Home / Self Care | Payer: Medicaid Other | Source: Ambulatory Visit | Attending: Physician Assistant | Admitting: Physician Assistant

## 2021-03-24 ENCOUNTER — Other Ambulatory Visit: Payer: Self-pay

## 2021-03-24 VITALS — BP 119/69 | HR 112 | Temp 98.6°F | Resp 18 | Ht 65.0 in | Wt 175.0 lb

## 2021-03-24 DIAGNOSIS — J069 Acute upper respiratory infection, unspecified: Secondary | ICD-10-CM

## 2021-03-24 DIAGNOSIS — R0981 Nasal congestion: Secondary | ICD-10-CM | POA: Diagnosis not present

## 2021-03-24 DIAGNOSIS — J029 Acute pharyngitis, unspecified: Secondary | ICD-10-CM | POA: Diagnosis not present

## 2021-03-24 LAB — GROUP A STREP BY PCR: Group A Strep by PCR: NOT DETECTED

## 2021-03-24 NOTE — Discharge Instructions (Signed)
URI/COLD SYMPTOMS: Negative strep. Your exam today is consistent with a viral illness. Antibiotics are not indicated at this time. Use medications as directed, including MUCINEX D, flonase. Your symptoms should improve over the next few days and resolve within 7-10 days. Increase rest and fluids. F/u if symptoms worsen or predominate such as sore throat, ear pain, productive cough, shortness of breath, or if you develop high fevers or worsening fatigue over the next several days.

## 2021-03-24 NOTE — ED Triage Notes (Signed)
Pt c/o sore throat, nasal congestion, ear pain, loss of hearing, cough with dark green phlegm x5days.  Pt was around her son who was positive for Strep and negative for Covid.

## 2021-03-24 NOTE — ED Provider Notes (Signed)
[facility]    CSN: [id number] Arrival date & time: 03/24/21  1345      History   Chief Complaint Chief Complaint  Patient presents with   Cough    Appt @ 2   Nasal Congestion    HPI Dorothy Nguyen is a 26 y.o. female presenting for 5-day history of cough, nasal congestion, sore throat, ear pain bilaterally.  Cough is productive of greenish sputum.  Patient says both of her children were recently diagnosed with strep.  She has not taken much medication for symptoms.  No COVID or flu exposure.  Has not tested.  Patient otherwise healthy.  No other complaints.  HPI  Past Medical History:  Diagnosis Date   Endometriosis    Headache     Patient Active Problem List   Diagnosis Date Noted   Chronic pelvic pain in female 08/30/2014    Past Surgical History:  Procedure Laterality Date   LAPAROSCOPY N/A 08/30/2014   Procedure: LAPAROSCOPY DIAGNOSTIC;  Surgeon: Will Bonnet, MD;  Location: [facility];  Service: Gynecology;  Laterality: N/A;   TYMPANOSTOMY TUBE PLACEMENT     2 times    OB History   No obstetric history on file.      Home Medications    Prior to Admission medications   Medication Sig Start Date End Date Taking? Authorizing Provider  cyclobenzaprine (FLEXERIL) 10 MG tablet Take 1 tablet (10 mg total) by mouth at bedtime. 01/26/21   Melynda Ripple, MD  fluticasone (FLONASE) 50 MCG/ACT nasal spray Place 2 sprays into both nostrils daily. 01/26/21   Melynda Ripple, MD  naproxen (NAPROSYN) 500 MG tablet Take 1 tablet (500 mg total) by mouth 2 (two) times daily. 01/26/21   Melynda Ripple, MD  ipratropium (ATROVENT) 0.06 % nasal spray Place 2 sprays into both nostrils 4 (four) times daily as needed for rhinitis. 04/20/18 01/05/19  Coral Spikes, DO    Family History Family History  Problem Relation Age of Onset   Healthy Mother    Healthy Father     Social History Social History   Tobacco Use   Smoking status: Every Day     Packs/day: 0.50    Types: Cigarettes   Smokeless tobacco: Never  Vaping Use   Vaping Use: Never used  Substance Use Topics   Alcohol use: Not Currently   Drug use: No     Allergies   Hydrocodone, Doxycycline, and Drug [tape]   Review of Systems Review of Systems  Constitutional:  Positive for fatigue. Negative for chills, diaphoresis and fever.  HENT:  Positive for congestion, ear pain, rhinorrhea and sore throat. Negative for sinus pressure and sinus pain.   Respiratory:  Positive for cough. Negative for shortness of breath.   Gastrointestinal:  Negative for abdominal pain, nausea and vomiting.  Musculoskeletal:  Negative for arthralgias and myalgias.  Skin:  Negative for rash.  Neurological:  Negative for weakness and headaches.  Hematological:  Negative for adenopathy.    Physical Exam Triage Vital Signs ED Triage Vitals  Enc Vitals Group     BP 03/24/21 1359 119/69     Pulse Rate 03/24/21 1359 (!) 112     Resp 03/24/21 1359 18     Temp 03/24/21 1359 98.6 F (37 C)     Temp Source 03/24/21 1359 Oral     SpO2 03/24/21 1359 100 %     Weight 03/24/21 1357 175 lb (79.4 kg)     Height 03/24/21 1357  5\' 5"  (1.651 m)     Head Circumference --      Peak Flow --      Pain Score 03/24/21 1357 3     Pain Loc --      Pain Edu? --      Excl. in [place]? --    No data found.  Updated Vital Signs BP 119/69 (BP Location: Left Arm)    Pulse (!) 112    Temp 98.6 F (37 C) (Oral)    Resp 18    Ht 5\' 5"  (1.651 m)    Wt 175 lb (79.4 kg)    LMP 02/28/2021    SpO2 100%    BMI 29.12 kg/m      Physical Exam Vitals and nursing note reviewed.  Constitutional:      General: She is not in acute distress.    Appearance: Normal appearance. She is ill-appearing. She is not toxic-appearing.  HENT:     Head: Normocephalic and atraumatic.     Right Ear: A middle ear effusion is present. Tympanic membrane is not erythematous or bulging.     Left Ear: A middle ear effusion is present. Tympanic  membrane is not erythematous or bulging.     Nose: Congestion present.     Mouth/Throat:     Mouth: Mucous membranes are moist.     Pharynx: Oropharynx is clear. Posterior oropharyngeal erythema present.  Eyes:     General: No scleral icterus.       Right eye: No discharge.        Left eye: No discharge.     Conjunctiva/sclera: Conjunctivae normal.  Cardiovascular:     Rate and Rhythm: Regular rhythm. Tachycardia present.     Heart sounds: Normal heart sounds.  Pulmonary:     Effort: Pulmonary effort is normal. No respiratory distress.     Breath sounds: Normal breath sounds.  Musculoskeletal:     Cervical back: Neck supple.  Skin:    General: Skin is dry.  Neurological:     General: No focal deficit present.     Mental Status: She is alert. Mental status is at baseline.     Motor: No weakness.     Gait: Gait normal.  Psychiatric:        Mood and Affect: Mood normal.        Behavior: Behavior normal.        Thought Content: Thought content normal.     UC Treatments / Results  Labs (all labs ordered are listed, but only abnormal results are displayed) Labs Reviewed  GROUP A STREP BY PCR    EKG   Radiology No results found.  Procedures Procedures (including critical care time)  Medications Ordered in UC Medications - No data to display  Initial Impression / Assessment and Plan / UC Course  I have reviewed the triage vital signs and the nursing notes.  Pertinent labs & imaging results that were available during my care of the patient were reviewed by me and considered in my medical decision making (see chart for details).  26 year old female presenting for 5-day history of cough, congestion, sore throat.  Exposed to strep.  Vital stable.  Patient is mildly ill-appearing but nontoxic.  Exam significant for effusion of bilateral TMs without erythema or bulging, nasal congestion and posterior pharyngeal erythema.  Patient is mildly tachycardic at 112 bpm.  Chest  clear to auscultation and heart regular rhythm.  PCR strep negative.  Discussed result with patient.  We also discussed testing for COVID-19 versus just wearing a mask for the next few days since she is already through the 5-day isolation period.  Patient was hold off on testing.  Advised her symptoms consistent with viral URI.  Supportive care encouraged with increasing rest and fluids, Mucinex D, Flonase and saline.  Advise she should feel better within 7 to 10 days.  Follow-up as needed for any worsening symptoms or if not better after 7 to 10 days.   Final Clinical Impressions(s) / UC Diagnoses   Final diagnoses:  Viral upper respiratory tract infection  Sore throat  Nasal congestion     Discharge Instructions      URI/COLD SYMPTOMS: Negative strep. Your exam today is consistent with a viral illness. Antibiotics are not indicated at this time. Use medications as directed, including MUCINEX D, flonase. Your symptoms should improve over the next few days and resolve within 7-10 days. Increase rest and fluids. F/u if symptoms worsen or predominate such as sore throat, ear pain, productive cough, shortness of breath, or if you develop high fevers or worsening fatigue over the next several days.       ED Prescriptions   None    PDMP not reviewed this encounter.   Danton Clap, PA-C 03/24/21 1534

## 2022-07-06 ENCOUNTER — Ambulatory Visit
Admission: RE | Admit: 2022-07-06 | Discharge: 2022-07-06 | Disposition: A | Discharge status: Home / Self Care | Payer: Medicaid Other | Source: Ambulatory Visit | Attending: Family Medicine | Admitting: Family Medicine

## 2022-07-06 VITALS — BP 106/71 | HR 92 | Temp 98.6°F | Ht 65.0 in

## 2022-07-06 DIAGNOSIS — H66012 Acute suppurative otitis media with spontaneous rupture of ear drum, left ear: Secondary | ICD-10-CM | POA: Diagnosis not present

## 2022-07-06 MED ORDER — FLUCONAZOLE 150 MG PO TABS: 150.0000 mg | ORAL_TABLET | ORAL | 0 refills | Status: AC

## 2022-07-06 MED ORDER — AMOXICILLIN-POT CLAVULANATE 875-125 MG PO TABS: 1.0000 | ORAL_TABLET | Freq: Two times a day (BID) | ORAL | 0 refills | Status: DC

## 2022-07-06 MED ORDER — IBUPROFEN 600 MG PO TABS: 600.0000 mg | ORAL_TABLET | Freq: Four times a day (QID) | ORAL | 0 refills | Status: DC | PRN

## 2022-07-06 NOTE — Discharge Instructions (Addendum)
Stop by the pharmacy to pick up your prescriptions.  Follow up with your primary care provider as needed.  

## 2022-07-06 NOTE — ED Provider Notes (Signed)
[facility]    CSN: [id number] Arrival date & time: 07/06/22  1149      History   Chief Complaint Chief Complaint  Patient presents with   Ear Drainage    Ear pain, pressureCongestion and cough - Entered by patient    HPI Dorothy Nguyen is a 27 y.o. female.   HPI   Dorothy Nguyen presents for left ear pain with associated fullness, pressure, and fluid drainage .Has throbbing pain and hearing difficulty. She has been using a nasal rinse, allergy medications, warm compressed and Mucinex. She had tubes as a kid. She has sensitive ears.  Friday morning, she had a sore throat and nasal congestion.  She has some bilateral neck pain.       Past Medical History:  Diagnosis Date   Endometriosis    Headache     Patient Active Problem List   Diagnosis Date Noted   Chronic pelvic pain in female 08/30/2014    Past Surgical History:  Procedure Laterality Date   LAPAROSCOPY N/A 08/30/2014   Procedure: LAPAROSCOPY DIAGNOSTIC;  Surgeon: Conard Novak, MD;  Location: [facility];  Service: Gynecology;  Laterality: N/A;   TYMPANOSTOMY TUBE PLACEMENT     2 times    OB History   No obstetric history on file.      Home Medications    Prior to Admission medications   Medication Sig Start Date End Date Taking? Authorizing Provider  amoxicillin-clavulanate (AUGMENTIN) 875-125 MG tablet Take 1 tablet by mouth every 12 (twelve) hours. 07/06/22  Yes Tayshaun Kroh, DO  fluconazole (DIFLUCAN) 150 MG tablet Take 1 tablet (150 mg total) by mouth every 3 (three) days for 2 doses. 07/06/22 07/10/22 Yes Teriana Danker, DO  ibuprofen (ADVIL) 600 MG tablet Take 1 tablet (600 mg total) by mouth every 6 (six) hours as needed. 07/06/22  Yes Lillionna Nabi, DO  cyclobenzaprine (FLEXERIL) 10 MG tablet Take 1 tablet (10 mg total) by mouth at bedtime. 01/26/21   Domenick Gong, MD  fluticasone (FLONASE) 50 MCG/ACT nasal spray Place 2 sprays into both nostrils daily. 01/26/21    Domenick Gong, MD  naproxen (NAPROSYN) 500 MG tablet Take 1 tablet (500 mg total) by mouth 2 (two) times daily. 01/26/21   Domenick Gong, MD  ipratropium (ATROVENT) 0.06 % nasal spray Place 2 sprays into both nostrils 4 (four) times daily as needed for rhinitis. 04/20/18 01/05/19  Tommie Sams, DO    Family History Family History  Problem Relation Age of Onset   Healthy Mother    Healthy Father     Social History Social History   Tobacco Use   Smoking status: Every Day    Packs/day: .5    Types: Cigarettes   Smokeless tobacco: Never  Vaping Use   Vaping Use: Never used  Substance Use Topics   Alcohol use: Not Currently   Drug use: No     Allergies   Hydrocodone, Doxycycline, and Drug [tape]   Review of Systems Review of Systems: :negative unless otherwise stated in HPI.      Physical Exam Triage Vital Signs ED Triage Vitals  Enc Vitals Group     BP 07/06/22 1213 106/71     Pulse Rate 07/06/22 1213 92     Resp --      Temp 07/06/22 1213 98.6 F (37 C)     Temp Source 07/06/22 1213 Oral     SpO2 07/06/22 1213 98 %     Weight --  Height 07/06/22 1213 5\' 5"  (1.651 m)     Head Circumference --      Peak Flow --      Pain Score 07/06/22 1212 3     Pain Loc --      Pain Edu? --      Excl. in [place]? --    No data found.  Updated Vital Signs BP 106/71 (BP Location: Right Arm)   Pulse 92   Temp 98.6 F (37 C) (Oral)   Ht 5\' 5"  (1.651 m)   LMP 06/15/2022 (Exact Date)   SpO2 98%   BMI 29.12 kg/m   Visual Acuity Right Eye Distance:   Left Eye Distance:   Bilateral Distance:    Right Eye Near:   Left Eye Near:    Bilateral Near:     Physical Exam GEN:     alert, non-toxic appearing female in no distress    HENT:  mucus membranes moist, clear nasal discharge, right TM effusion, left TM retracted at the base, opaque and erythematous    EYES:   no scleral injection or drainage  NECK:  normal ROM, no meningismus   RESP:  no increased work of  breathing CVS:   regular rate  Skin:   warm and dry, no rash on visible skin    UC Treatments / Results  Labs (all labs ordered are listed, but only abnormal results are displayed) Labs Reviewed - No data to display  EKG   Radiology No results found.  Procedures Procedures (including critical care time)  Medications Ordered in UC Medications - No data to display  Initial Impression / Assessment and Plan / UC Course  I have reviewed the triage vital signs and the nursing notes.  Pertinent labs & imaging results that were available during my care of the patient were reviewed by me and considered in my medical decision making (see chart for details).        Acute Otitis media Overall patient is well-appearing, well-hydrated and without respiratory distress. Dorothy Nguyen is afebrile. Treat with Augmentin BID for 10 days. Motrin 600 mg for ear pain.  Tylenol  as needed for fever or discomfort.    Discussed MDM, treatment plan and plan for follow-up with patient who agrees with plan.   Final Clinical Impressions(s) / UC Diagnoses   Final diagnoses:  Non-recurrent acute suppurative otitis media of left ear with spontaneous rupture of tympanic membrane     Discharge Instructions      Stop by the pharmacy to pick up your prescriptions.  Follow up with your primary care provider as needed.      ED Prescriptions     Medication Sig Dispense Auth. Provider   amoxicillin-clavulanate (AUGMENTIN) 875-125 MG tablet Take 1 tablet by mouth every 12 (twelve) hours. 20 tablet Kupono Marling, DO   fluconazole (DIFLUCAN) 150 MG tablet Take 1 tablet (150 mg total) by mouth every 3 (three) days for 2 doses. 2 tablet Trishna Cwik, DO   ibuprofen (ADVIL) 600 MG tablet Take 1 tablet (600 mg total) by mouth every 6 (six) hours as needed. 30 tablet Katha Cabal, DO      PDMP not reviewed this encounter.   Katha Cabal, DO 07/06/22 1255

## 2022-07-06 NOTE — ED Triage Notes (Signed)
Pt c/o congestion, drainage, LT ear muffled & aches, pt reports she feels fluid in ear as well.

## 2022-07-23 ENCOUNTER — Ambulatory Visit
Admission: RE | Admit: 2022-07-23 | Discharge: 2022-07-23 | Disposition: A | Discharge status: Home / Self Care | Payer: Medicaid Other | Source: Ambulatory Visit | Attending: Emergency Medicine | Admitting: Emergency Medicine

## 2022-07-23 VITALS — BP 115/80 | HR 94 | Temp 99.2°F | Ht 65.0 in

## 2022-07-23 DIAGNOSIS — H6991 Unspecified Eustachian tube disorder, right ear: Secondary | ICD-10-CM | POA: Diagnosis not present

## 2022-07-23 DIAGNOSIS — J069 Acute upper respiratory infection, unspecified: Secondary | ICD-10-CM | POA: Diagnosis not present

## 2022-07-23 LAB — GROUP A STREP BY PCR: Group A Strep by PCR: NOT DETECTED

## 2022-07-23 MED ORDER — METHYLPREDNISOLONE 4 MG PO TBPK: ORAL_TABLET | ORAL | 0 refills | Status: DC

## 2022-07-23 MED ORDER — IPRATROPIUM BROMIDE 0.06 % NA SOLN: 2.0000 | Freq: Four times a day (QID) | NASAL | 12 refills | Status: DC

## 2022-07-23 NOTE — ED Triage Notes (Signed)
Pt c/o sore throat and RT ear ache. Pt states last month she had a ruptured LT ear drum.

## 2022-07-23 NOTE — Discharge Instructions (Signed)
Use the Atrovent nasal spray, 2 squirts in each nostril every 6 hours, to help with nasal congestion.  Take over-the-counter Zyrtec, Claritin, or Allegra once daily to help with allergic symptoms.  Instill 2 squirts of fluticasone in each nostril at bedtime nightly.  And the nasal away from the septum of your nose and follow each set of squirts with 1 squirt of nasal saline to push the particles up into your turbinates where they will take effect.  Continue to equalize your ears as shown to help clear mucus from eustachian tubes and maintain patency.  Starting tomorrow morning take the Medrol Dosepak according to the package instructions.  This will decrease inflammation in your eustachian tube and allow it to drain.  You can also sleep on a hot water bottle or heating pad set on low.  This will help dilate the eustachian tube and help decrease the pressure by allowing the eustachian tube to drain.  Please return for reevaluation for new or worsening symptoms.

## 2022-07-23 NOTE — ED Provider Notes (Signed)
[facility]    CSN: [id number] Arrival date & time: 07/23/22  1048      History   Chief Complaint Chief Complaint  Patient presents with   Sore Throat    Very sore throat with right ear pain. Can't swallow, drink or eat without severe pain. - Entered by patient   Otalgia    HPI Dorothy Nguyen is a 27 y.o. female.   HPI  27 year old female with a past medical history significant for endometriosis and headaches presents for evaluation of sore throat and right ear pain.  She reports that she has felt a lot of popping and crackling in her right ear and that by the end of the day she has pain when she swallows in her right ear and along the right side of her neck to the back of her throat.  This has been associated with runny nose, nasal congestion, and postnasal drip.  Also intermittent cough.  She denies any fever or drainage from her ear.  She was recently treated for otitis media in her left ear with a spontaneously ruptured tympanic membrane.  She reports that she completed 15 days of Augmentin for that infection.  Does have a history of tympanostomy tube placement when she was a child.  Past Medical History:  Diagnosis Date   Endometriosis    Headache     Patient Active Problem List   Diagnosis Date Noted   Chronic pelvic pain in female 08/30/2014    Past Surgical History:  Procedure Laterality Date   LAPAROSCOPY N/A 08/30/2014   Procedure: LAPAROSCOPY DIAGNOSTIC;  Surgeon: Conard Novak, MD;  Location: [facility];  Service: Gynecology;  Laterality: N/A;   TYMPANOSTOMY TUBE PLACEMENT     2 times    OB History   No obstetric history on file.      Home Medications    Prior to Admission medications   Medication Sig Start Date End Date Taking? Authorizing Provider  ipratropium (ATROVENT) 0.06 % nasal spray Place 2 sprays into both nostrils 4 (four) times daily. 07/23/22  Yes Becky Augusta, NP  methylPREDNISolone (MEDROL DOSEPAK) 4 MG TBPK tablet Take  according to the package insert. 07/23/22  Yes Becky Augusta, NP  cyclobenzaprine (FLEXERIL) 10 MG tablet Take 1 tablet (10 mg total) by mouth at bedtime. 01/26/21   Domenick Gong, MD  fluticasone (FLONASE) 50 MCG/ACT nasal spray Place 2 sprays into both nostrils daily. 01/26/21   Domenick Gong, MD  ibuprofen (ADVIL) 600 MG tablet Take 1 tablet (600 mg total) by mouth every 6 (six) hours as needed. 07/06/22   Brimage, Seward Meth, DO  naproxen (NAPROSYN) 500 MG tablet Take 1 tablet (500 mg total) by mouth 2 (two) times daily. 01/26/21   Domenick Gong, MD    Family History Family History  Problem Relation Age of Onset   Healthy Mother    Healthy Father     Social History Social History   Tobacco Use   Smoking status: Every Day    Packs/day: .5    Types: Cigarettes   Smokeless tobacco: Never  Vaping Use   Vaping Use: Never used  Substance Use Topics   Alcohol use: Not Currently   Drug use: No     Allergies   Hydrocodone, Doxycycline, and Drug [tape]   Review of Systems Review of Systems  Constitutional:  Negative for fever.  HENT:  Positive for congestion, ear pain, rhinorrhea and sore throat.   Respiratory:  Positive for cough.  Physical Exam Triage Vital Signs ED Triage Vitals  Enc Vitals Group     BP 07/23/22 1115 115/80     Pulse Rate 07/23/22 1115 94     Resp --      Temp 07/23/22 1115 99.2 F (37.3 C)     Temp Source 07/23/22 1115 Oral     SpO2 07/23/22 1115 96 %     Weight --      Height 07/23/22 1055 5\' 5"  (1.651 m)     Head Circumference --      Peak Flow --      Pain Score 07/23/22 1114 2     Pain Loc --      Pain Edu? --      Excl. in [place]? --    No data found.  Updated Vital Signs BP 115/80 (BP Location: Right Arm)   Pulse 94   Temp 99.2 F (37.3 C) (Oral)   Ht 5\' 5"  (1.651 m)   LMP 06/15/2022 (Exact Date)   SpO2 96%   BMI 29.12 kg/m   Visual Acuity Right Eye Distance:   Left Eye Distance:   Bilateral Distance:    Right  Eye Near:   Left Eye Near:    Bilateral Near:     Physical Exam Vitals and nursing note reviewed.  Constitutional:      Appearance: Normal appearance. She is not ill-appearing.  HENT:     Head: Normocephalic and atraumatic.     Right Ear: Tympanic membrane, ear canal and external ear normal. There is no impacted cerumen.     Left Ear: Tympanic membrane, ear canal and external ear normal. There is no impacted cerumen.     Ears:     Comments: Right tympanic membrane is pearly gray in appearance.  There is a serous effusion and bubbles present behind the TM.  No bulging noted.  Both external auditory canals are clear.  The left TM is pearly gray in appearance and I do not see a patent hole at this time.    Nose: Congestion and rhinorrhea present.     Comments: His mucosa is erythematous and edematous with clear discharge in both nares.    Mouth/Throat:     Mouth: Mucous membranes are moist.     Pharynx: Oropharynx is clear. Posterior oropharyngeal erythema present. No oropharyngeal exudate.     Comments: Bilateral tonsillar pillars are erythematous edematous but free of exudate.  Posterior oropharynx also demonstrates erythema and clear postnasal drip. Neck:     Comments: No tenderness with external palpation of the right eustachian tube. Cardiovascular:     Rate and Rhythm: Normal rate and regular rhythm.     Pulses: Normal pulses.     Heart sounds: Normal heart sounds. No murmur heard.    No friction rub. No gallop.  Pulmonary:     Effort: Pulmonary effort is normal.     Breath sounds: Normal breath sounds. No wheezing, rhonchi or rales.  Musculoskeletal:     Cervical back: Normal range of motion and neck supple. No tenderness.  Lymphadenopathy:     Cervical: No cervical adenopathy.  Skin:    General: Skin is warm and dry.     Capillary Refill: Capillary refill takes less than 2 seconds.     Findings: No erythema or rash.  Neurological:     General: No focal deficit present.      Mental Status: She is alert and oriented to person, place, and time.  UC Treatments / Results  Labs (all labs ordered are listed, but only abnormal results are displayed) Labs Reviewed  GROUP A STREP BY PCR    EKG   Radiology No results found.  Procedures Procedures (including critical care time)  Medications Ordered in UC Medications - No data to display  Initial Impression / Assessment and Plan / UC Course  I have reviewed the triage vital signs and the nursing notes.  Pertinent labs & imaging results that were available during my care of the patient were reviewed by me and considered in my medical decision making (see chart for details).   Patient is a very pleasant, nontoxic-appearing 27 year old female presenting for evaluation of sore throat and right ear pain that started 6 days ago.  Finished a 15-day course of Augmentin for otitis media in her left ear that resulted in a spontaneous rupture.  She is concerned that she may have another rupture so she came in for evaluation.  On exam she does not have any erythema to either tympanic membrane but she does have a serous effusion behind the right TM.  She has been hearing popping and cracking and there are bubbles present.  She has no tenderness with palpation of the eustachian tube on the right externally but she does report that this is where she has pain in her throat and it is more towards the end of the day.  She does use Flonase daily for allergies.  She does have erythema to the posterior oropharynx so I will order a strep PCR but I suspect that this is more result of an upper respiratory infection and eustachian tube dysfunction on the right.  If the patient's strep test is negative I will discharge her home on a Medrol Dosepak to help with swelling in her eustachian tube along with Atrovent nasal spray to help with the nasal congestion and postnasal drip.  I will have her continue her Flonase.  Strep PCR is  negative.  I will discharge patient on the diagnosis of viral URI and also eustachian tube dysfunction on the right-hand side.  I will have her continue her Flonase and I will add on Atrovent nasal spray to open the nasal congestion and postnasal drip.  Also Medrol Dosepak to help decrease inflammation in her eustachian tube and help promote drainage.   Final Clinical Impressions(s) / UC Diagnoses   Final diagnoses:  Viral upper respiratory tract infection  Eustachian tube dysfunction, right     Discharge Instructions      Use the Atrovent nasal spray, 2 squirts in each nostril every 6 hours, to help with nasal congestion.  Take over-the-counter Zyrtec, Claritin, or Allegra once daily to help with allergic symptoms.  Instill 2 squirts of fluticasone in each nostril at bedtime nightly.  And the nasal away from the septum of your nose and follow each set of squirts with 1 squirt of nasal saline to push the particles up into your turbinates where they will take effect.  Continue to equalize your ears as shown to help clear mucus from eustachian tubes and maintain patency.  Starting tomorrow morning take the Medrol Dosepak according to the package instructions.  This will decrease inflammation in your eustachian tube and allow it to drain.  You can also sleep on a hot water bottle or heating pad set on low.  This will help dilate the eustachian tube and help decrease the pressure by allowing the eustachian tube to drain.  Please return for reevaluation for  new or worsening symptoms.       ED Prescriptions     Medication Sig Dispense Auth. Provider   methylPREDNISolone (MEDROL DOSEPAK) 4 MG TBPK tablet Take according to the package insert. 1 each Becky Augusta, NP   ipratropium (ATROVENT) 0.06 % nasal spray Place 2 sprays into both nostrils 4 (four) times daily. 15 mL Becky Augusta, NP      PDMP not reviewed this encounter.   Becky Augusta, NP 07/23/22 1149

## 2022-10-08 IMAGING — MR MR WRIST*R* W/O CM
6 series · 40 of 40 positions shown · non-contrast
Comparison: Wrist radiograph 12/15/2019

CLINICAL DATA: Pain, weakness, and limited range of motion

EXAM:
MR OF THE RIGHT WRIST WITHOUT CONTRAST
TECHNIQUE: Multiplanar, multisequence MR imaging of the right wrist was
performed. No intravenous contrast was administered.

[Series 6: T2 fat-sat · axial · right · 3.0mm · 0.31mm/px · z∈[-99,-35]mm · 9 of 21 slices shown (1 of 2)]
[im 1/21]
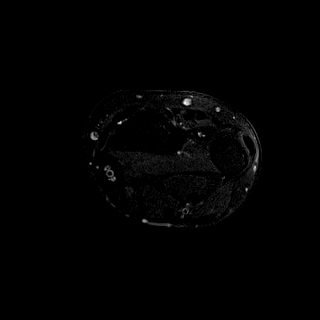
[im 3/21]
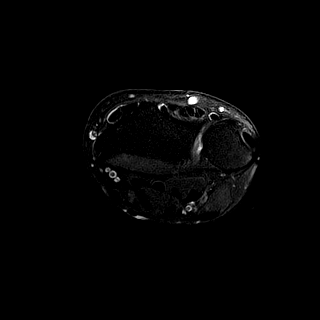
[im 6/21]
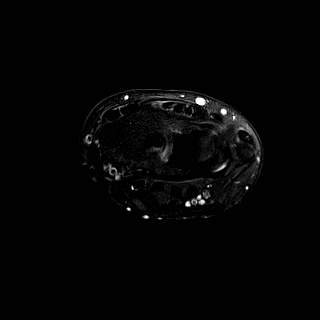
[im 8/21]
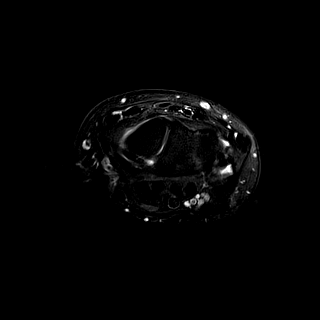
[im 11/21]
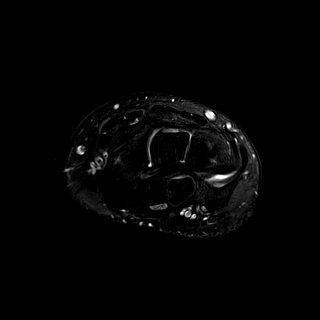
[im 13/21]
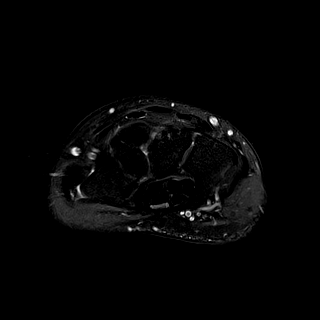
[im 16/21]
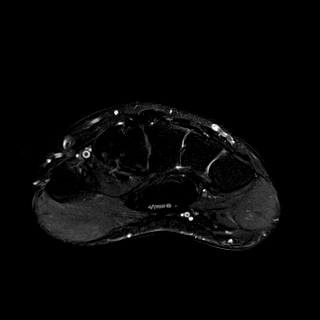
[im 18/21]
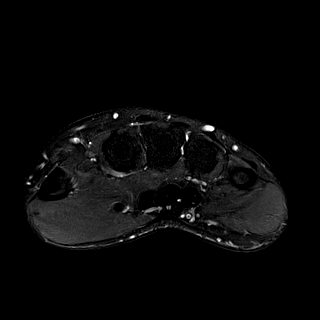
[im 21/21]
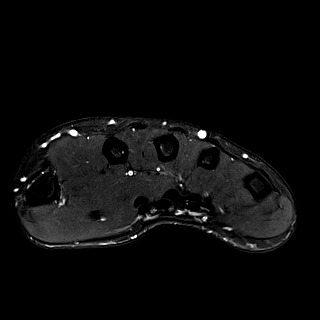

[Series 7: T1 · axial · right · 3.0mm · 0.31mm/px · z∈[-99,-35]mm · 8 of 21 slices shown (1 of 2)]
[im 1/21]
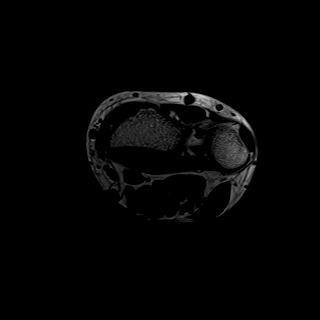
[im 3/21]
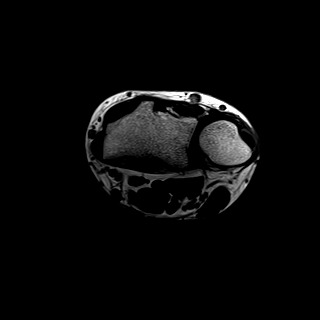
[im 6/21]
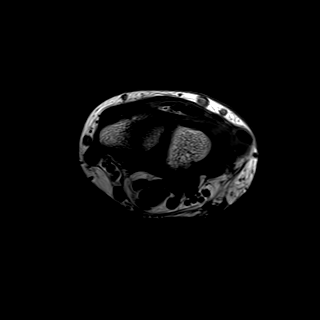
[im 9/21]
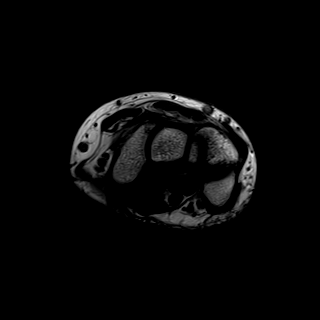
[im 12/21]
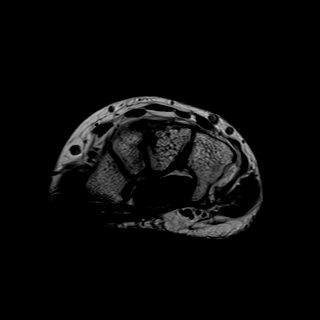
[im 15/21]
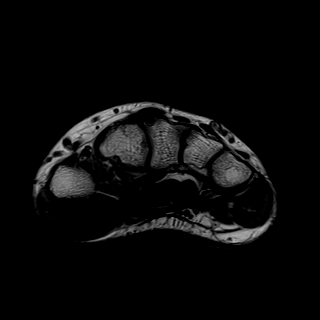
[im 18/21]
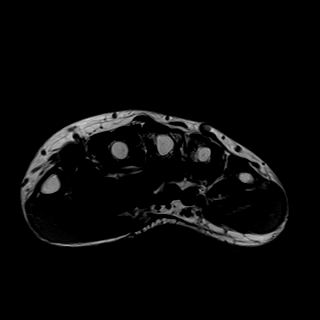
[im 21/21]
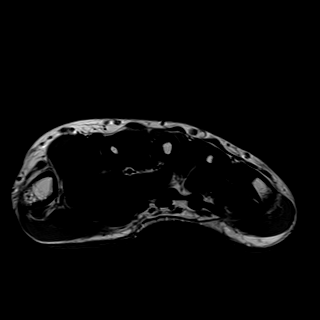

[Series 8: T1 · coronal · right · 3.0mm · 0.23mm/px · 5 of 13 slices shown (2 of 2)]
[im 1/13]
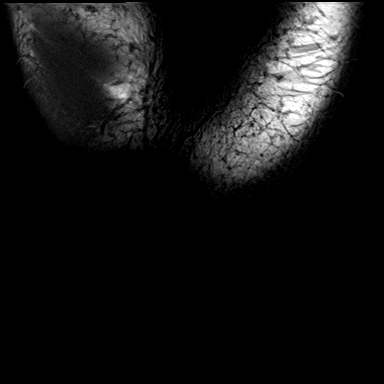
[im 4/13]
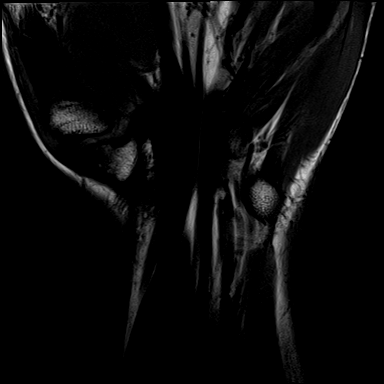
[im 7/13]
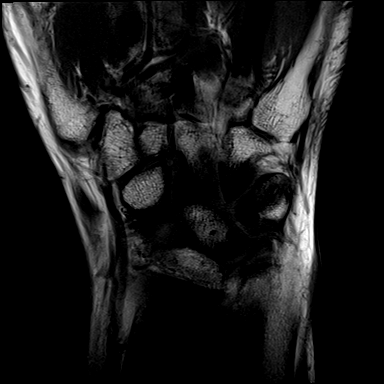
[im 10/13]
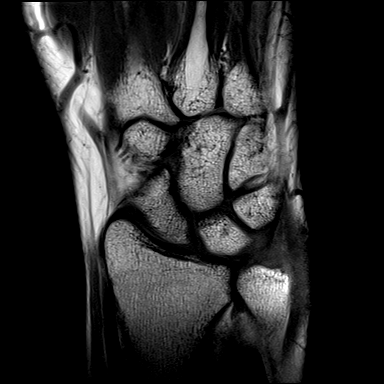
[im 13/13]
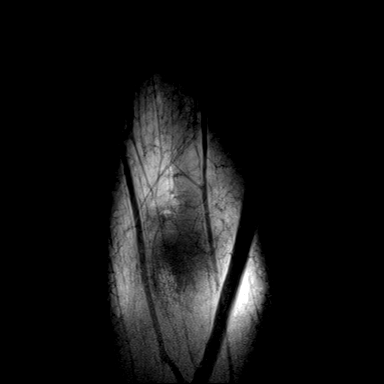

[Series 9: T2 fat-sat · coronal · right · 3.0mm · 0.31mm/px · 5 of 12 slices shown (2 of 2)]
[im 1/12]
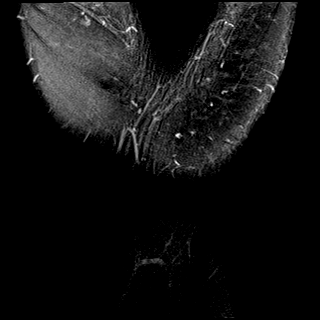
[im 3/12]
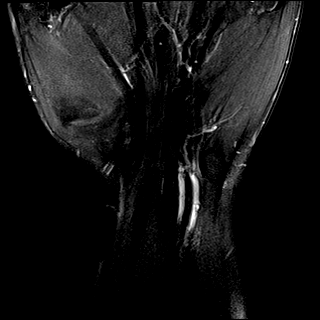
[im 6/12]
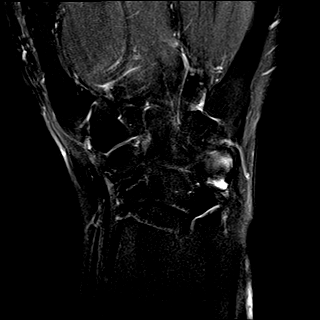
[im 9/12]
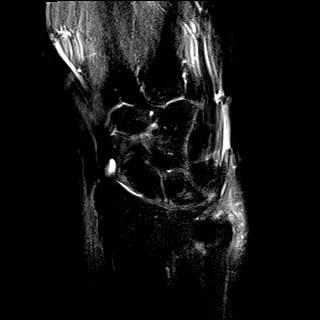
[im 12/12]
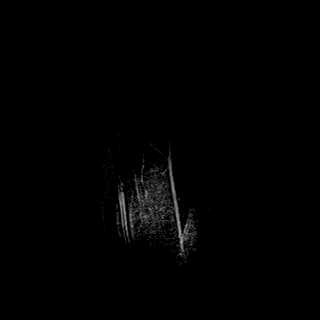

[Series 10: PD fat-sat · coronal · right · 3.0mm · 0.31mm/px · 5 of 13 slices shown (1 of 2)]
[im 1/13]
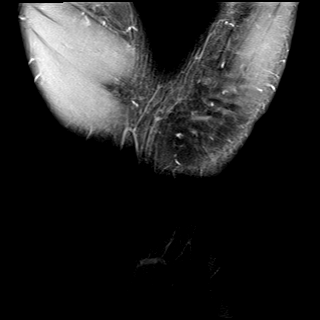
[im 4/13]
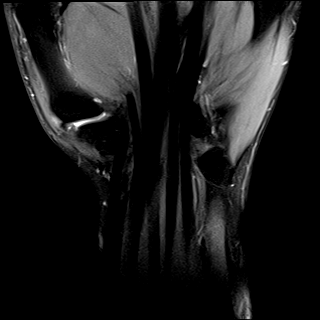
[im 7/13]
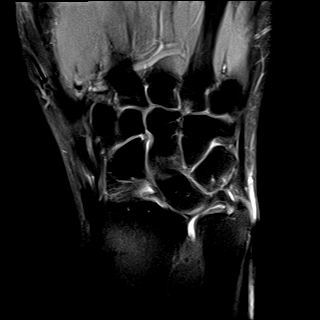
[im 10/13]
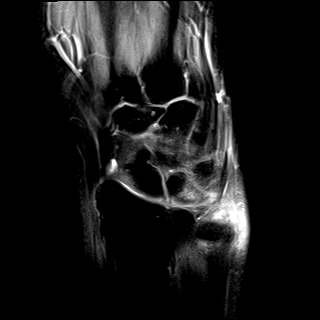
[im 13/13]
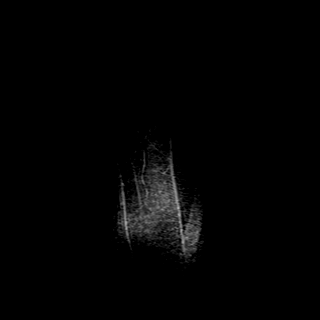

[Series 11: PD fat-sat · sagittal · right · 3.0mm · 0.31mm/px · 8 of 20 slices shown (2 of 2)]
[im 1/20]
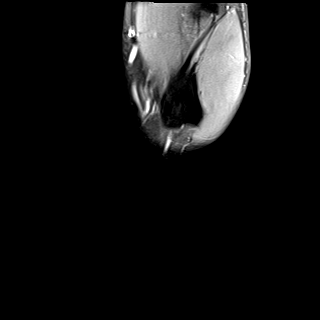
[im 3/20]
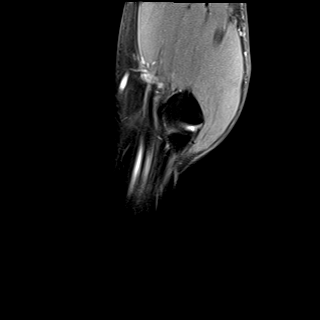
[im 6/20]
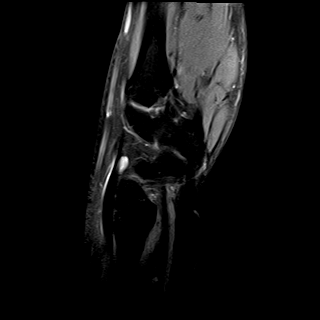
[im 9/20]
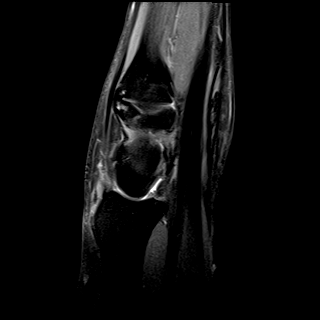
[im 11/20]
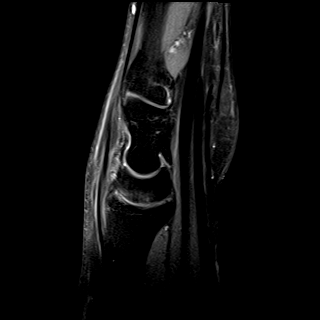
[im 14/20]
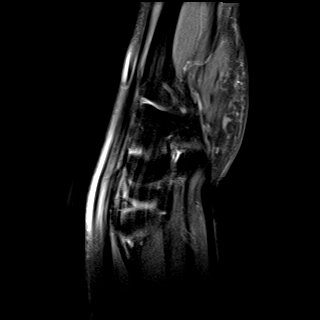
[im 17/20]
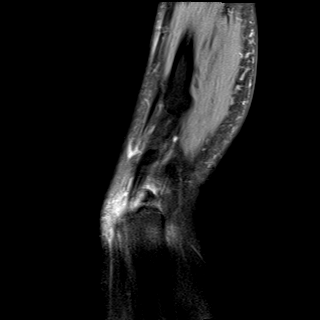
[im 20/20]
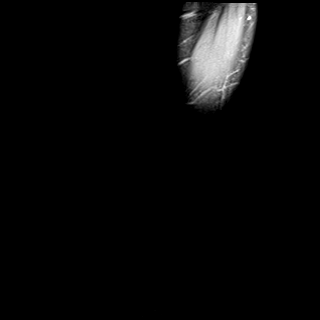

[40 of 40 positions shown; findings below may reference images not displayed]

FINDINGS: Ligaments: Scapholunate and lunotriquetral ligaments are intact.

Triangular fibrocartilage: There is a central tear of the TFCC
articular disc. The dorsal and volar radioulnar ligaments are
intact.

Tendons: There is mild focal fluid surrounding the second extensor
compartment at the level of the carpus. The other extensor tendons
and flexor tendons are unremarkable.

Carpal tunnel/median nerve: Flexor retinaculum is intact. Normal
carpal tunnel without a mass. Median nerve demonstrates normal
signal and caliber.

Guyon's canal: Normal Guyon's canal. Normal ulnar nerve.

Joint/cartilage: There is partial-thickness cartilage loss of the
radiocarpal joint mainly at the radiolunate articulation. Carpal
alignment is normal.

Bones/carpal alignment: There is an old healed distal radius
fracture.

Other: Muscles are normal. No fluid collection, hematoma, or soft
tissue mass.
IMPRESSION: Central tear of the TFCC articular disc. Intact dorsal and volar
radioulnar ligaments.

Old healed distal radius fracture. Mild posttraumatic arthritis of
the radiocarpal joint, with partial thickness cartilage loss at the
radiolunate articulation.

Mild focal fluid surrounding the second extensor compartment at the
level of the carpus, which could reflect mild tenosynovitis,
correlate with point tenderness.

## 2023-01-07 ENCOUNTER — Ambulatory Visit
Admission: RE | Admit: 2023-01-07 | Discharge: 2023-01-07 | Disposition: A | Discharge status: Home / Self Care | Payer: Medicaid Other | Source: Ambulatory Visit | Attending: Physician Assistant | Admitting: Physician Assistant

## 2023-01-07 VITALS — BP 100/75 | HR 91 | Temp 98.2°F | Ht 65.0 in | Wt 190.0 lb

## 2023-01-07 DIAGNOSIS — J209 Acute bronchitis, unspecified: Secondary | ICD-10-CM

## 2023-01-07 DIAGNOSIS — R051 Acute cough: Secondary | ICD-10-CM

## 2023-01-07 MED ORDER — AZITHROMYCIN 250 MG PO TABS: 250.0000 mg | ORAL_TABLET | Freq: Every day | ORAL | 0 refills | Status: DC

## 2023-01-07 MED ORDER — PROMETHAZINE-DM 6.25-15 MG/5ML PO SYRP: 5.0000 mL | ORAL_SOLUTION | Freq: Four times a day (QID) | ORAL | 0 refills | Status: DC | PRN

## 2023-01-07 NOTE — ED Triage Notes (Signed)
Patient presents with cough, congestion and difficult breathing, states she thought it may be turning into pnemonia. Symptoms x 5 days. Treated with Mucinex and drinking a lot of water.

## 2023-01-07 NOTE — Discharge Instructions (Addendum)
-  Likely viral but given that you have been ill for awhile without improvement, lets try antibiotics. -Increase rest and fluids -Cough medicine as needed -Return if fever, worsening cough or trouble breathing and we will obtain chest x-ray if needed.

## 2023-01-07 NOTE — ED Provider Notes (Signed)
[facility]    CSN: [id number] Arrival date & time: 01/07/23  1011      History   Chief Complaint Chief Complaint  Patient presents with   Cough    Cough, congestion, chest hurts when I cough - Entered by patient    HPI Dorothy Nguyen is a 27 y.o. female presenting for chest heaviness and pressure x 10 days.  Patient reports cough is recently worsened and now she feels short of breath.  Denies fever, nasal congestion, sore throat, abdominal pain, vomiting or diarrhea.  Her children are sick with similar symptoms.  She feels that her symptoms are just getting worse despite taking multiple over-the-counter cough medicines and Tylenol.  No history of asthma.  Concerned about pneumonia.  No other complaints.  HPI  Past Medical History:  Diagnosis Date   Endometriosis    Headache     Patient Active Problem List   Diagnosis Date Noted   Chronic pelvic pain in female 08/30/2014    Past Surgical History:  Procedure Laterality Date   LAPAROSCOPY N/A 08/30/2014   Procedure: LAPAROSCOPY DIAGNOSTIC;  Surgeon: Conard Novak, MD;  Location: [facility] ORS;  Service: Gynecology;  Laterality: N/A;   TYMPANOSTOMY TUBE PLACEMENT     2 times    OB History   No obstetric history on file.      Home Medications    Prior to Admission medications   Medication Sig Start Date End Date Taking? Authorizing Provider  azithromycin (ZITHROMAX) 250 MG tablet Take 1 tablet (250 mg total) by mouth daily. Take first 2 tablets together, then 1 every day until finished. 01/07/23  Yes Shirlee Latch, PA-C  promethazine-dextromethorphan (PROMETHAZINE-DM) 6.25-15 MG/5ML syrup Take 5 mLs by mouth 4 (four) times daily as needed. 01/07/23  Yes Eusebio Friendly B, PA-C  cyclobenzaprine (FLEXERIL) 10 MG tablet Take 1 tablet (10 mg total) by mouth at bedtime. 01/26/21   Domenick Gong, MD  fluticasone (FLONASE) 50 MCG/ACT nasal spray Place 2 sprays into both nostrils daily. 01/26/21    Domenick Gong, MD  ibuprofen (ADVIL) 600 MG tablet Take 1 tablet (600 mg total) by mouth every 6 (six) hours as needed. 07/06/22   Brimage, Seward Meth, DO  ipratropium (ATROVENT) 0.06 % nasal spray Place 2 sprays into both nostrils 4 (four) times daily. 07/23/22   Becky Augusta, NP  methylPREDNISolone (MEDROL DOSEPAK) 4 MG TBPK tablet Take according to the package insert. 07/23/22   Becky Augusta, NP  naproxen (NAPROSYN) 500 MG tablet Take 1 tablet (500 mg total) by mouth 2 (two) times daily. 01/26/21   Domenick Gong, MD    Family History Family History  Problem Relation Age of Onset   Healthy Mother    Healthy Father     Social History Social History   Tobacco Use   Smoking status: Every Day    Current packs/day: 0.50    Types: Cigarettes   Smokeless tobacco: Never  Vaping Use   Vaping status: Never Used  Substance Use Topics   Alcohol use: Not Currently   Drug use: No     Allergies   Hydrocodone, Doxycycline, and Drug [tape]   Review of Systems Review of Systems  Constitutional:  Positive for fatigue. Negative for chills, diaphoresis and fever.  HENT:  Positive for congestion. Negative for ear pain, rhinorrhea, sinus pressure, sinus pain and sore throat.   Respiratory:  Positive for cough, chest tightness and shortness of breath.   Cardiovascular:  Positive for chest pain.  Gastrointestinal:  Negative for abdominal pain, nausea and vomiting.  Musculoskeletal:  Negative for arthralgias and myalgias.  Skin:  Negative for rash.  Neurological:  Negative for weakness and headaches.  Hematological:  Negative for adenopathy.     Physical Exam Triage Vital Signs ED Triage Vitals  Encounter Vitals Group     BP      Systolic BP Percentile      Diastolic BP Percentile      Pulse      Resp      Temp      Temp src      SpO2      Weight      Height      Head Circumference      Peak Flow      Pain Score      Pain Loc      Pain Education      Exclude from Growth  Chart    No data found.  Updated Vital Signs BP 100/75 (BP Location: Left Arm)   Pulse 91   Temp 98.2 F (36.8 C) (Oral)   Ht 5\' 5"  (1.651 m)   Wt 190 lb (86.2 kg)   LMP 12/24/2022   SpO2 100%   BMI 31.62 kg/m      Physical Exam Vitals and nursing note reviewed.  Constitutional:      General: She is not in acute distress.    Appearance: Normal appearance. She is not ill-appearing or toxic-appearing.  HENT:     Head: Normocephalic and atraumatic.     Nose: Congestion present.     Mouth/Throat:     Mouth: Mucous membranes are moist.     Pharynx: Oropharynx is clear.  Eyes:     General: No scleral icterus.       Right eye: No discharge.        Left eye: No discharge.     Conjunctiva/sclera: Conjunctivae normal.  Cardiovascular:     Rate and Rhythm: Normal rate and regular rhythm.     Heart sounds: Normal heart sounds.  Pulmonary:     Effort: Pulmonary effort is normal. No respiratory distress.     Breath sounds: Normal breath sounds.  Musculoskeletal:     Cervical back: Neck supple.  Skin:    General: Skin is dry.  Neurological:     General: No focal deficit present.     Mental Status: She is alert. Mental status is at baseline.     Motor: No weakness.     Gait: Gait normal.  Psychiatric:        Mood and Affect: Mood normal.        Behavior: Behavior normal.        Thought Content: Thought content normal.      UC Treatments / Results  Labs (all labs ordered are listed, but only abnormal results are displayed) Labs Reviewed - No data to display  EKG   Radiology No results found.  Procedures Procedures (including critical care time)  Medications Ordered in UC Medications - No data to display  Initial Impression / Assessment and Plan / UC Course  I have reviewed the triage vital signs and the nursing notes.  Pertinent labs & imaging results that were available during my care of the patient were reviewed by me and considered in my medical decision  making (see chart for details).   27 year old female presents for cough, congestion, chest pressure/tightness and shortness of breath.  Original onset of symptoms was  10 days ago but symptoms recently worsened.  No associated fever.  Children sick as well.  Vitals are normal and stable.  She is overall well-appearing.  No acute distress.  Slight nasal congestion.  Throat clear.  Chest clear to auscultation.  Gave patient the option of obtaining a chest x-ray to evaluate for possible pneumonia versus treating her with an antibiotic to cover for possible early pneumonia.  She would like to hold off on the x-ray.  Sent azithromycin and Promethazine DM to pharmacy.  Reviewed returning if fever or worsening chest pain or breathing difficulty.  Patient is understanding and agreeable.   Final Clinical Impressions(s) / UC Diagnoses   Final diagnoses:  Acute bronchitis, unspecified organism  Acute cough     Discharge Instructions      -Likely viral but given that you have been ill for awhile without improvement, lets try antibiotics. -Increase rest and fluids -Cough medicine as needed -Return if fever, worsening cough or trouble breathing and we will obtain chest x-ray if needed.     ED Prescriptions     Medication Sig Dispense Auth. Provider   azithromycin (ZITHROMAX) 250 MG tablet Take 1 tablet (250 mg total) by mouth daily. Take first 2 tablets together, then 1 every day until finished. 6 tablet Shirlee Latch, PA-C   promethazine-dextromethorphan (PROMETHAZINE-DM) 6.25-15 MG/5ML syrup Take 5 mLs by mouth 4 (four) times daily as needed. 118 mL Shirlee Latch, PA-C      PDMP not reviewed this encounter.   Shirlee Latch, PA-C 01/07/23 1124

## 2023-10-02 ENCOUNTER — Ambulatory Visit: Payer: Self-pay

## 2023-10-06 ENCOUNTER — Telehealth: Admitting: Physician Assistant

## 2023-10-06 DIAGNOSIS — R21 Rash and other nonspecific skin eruption: Secondary | ICD-10-CM

## 2023-10-07 ENCOUNTER — Telehealth: Admitting: Nurse Practitioner

## 2023-10-07 ENCOUNTER — Telehealth: Admitting: Family Medicine

## 2023-10-07 DIAGNOSIS — B379 Candidiasis, unspecified: Secondary | ICD-10-CM

## 2023-10-07 DIAGNOSIS — B9689 Other specified bacterial agents as the cause of diseases classified elsewhere: Secondary | ICD-10-CM

## 2023-10-07 DIAGNOSIS — T3695XA Adverse effect of unspecified systemic antibiotic, initial encounter: Secondary | ICD-10-CM | POA: Diagnosis not present

## 2023-10-07 DIAGNOSIS — L089 Local infection of the skin and subcutaneous tissue, unspecified: Secondary | ICD-10-CM | POA: Diagnosis not present

## 2023-10-07 MED ORDER — FLUCONAZOLE 150 MG PO TABS: 150.0000 mg | ORAL_TABLET | ORAL | 0 refills | Status: DC

## 2023-10-07 MED ORDER — SULFAMETHOXAZOLE-TRIMETHOPRIM 800-160 MG PO TABS: 1.0000 | ORAL_TABLET | Freq: Two times a day (BID) | ORAL | 0 refills | Status: AC

## 2023-10-07 MED ORDER — NYSTATIN-TRIAMCINOLONE 100000-0.1 UNIT/GM-% EX OINT: 1.0000 | TOPICAL_OINTMENT | Freq: Two times a day (BID) | CUTANEOUS | 0 refills | Status: AC

## 2023-10-07 NOTE — Progress Notes (Signed)
   Thank you for the details you included in the comment boxes. Those details are very helpful in determining the best course of treatment for you and help us  to provide the best care.Because of need for further discussion of symptoms and examination of rash to best get you treated properly, we recommend that you schedule a Virtual Urgent Care video visit in order for the provider to better assess what is going on.  The provider will be able to give you a more accurate diagnosis and treatment plan if we can more freely discuss your symptoms and with the addition of a virtual examination.   If you change your visit to a video visit, we will bill your insurance (similar to an office visit) and you will not be charged for this e-Visit. You will be able to stay at home and speak with the first available [facility] advanced practice provider. The link to do a video visit is in the drop down Menu tab of your Welcome screen in MyChart.

## 2023-10-07 NOTE — Patient Instructions (Addendum)
  Dorothy Nguyen, thank you for joining Chiquita CHRISTELLA Barefoot, NP for today's virtual visit.  While this provider is not your primary care provider (PCP), if your PCP is located in our provider database this encounter information will be shared with them immediately following your visit.   A [facility] MyChart account gives you access to today's visit and all your visits, tests, and labs performed at [facility]  click here if you don't have a [facility] MyChart account or go to [email or web address]  Consent: (Patient) Dorothy Nguyen provided verbal consent for this virtual visit at the beginning of the encounter.  Current Medications:  Current Outpatient Medications:    fluconazole  (DIFLUCAN ) 150 MG tablet, Take 1 tablet (150 mg total) by mouth as directed. Repeat in 3 days as needed, Disp: 2 tablet, Rfl: 0   nystatin -triamcinolone  ointment (MYCOLOG), Apply 1 Application topically 2 (two) times daily., Disp: 60 g, Rfl: 0   sulfamethoxazole -trimethoprim  (BACTRIM  DS) 800-160 MG tablet, Take 1 tablet by mouth 2 (two) times daily for 7 days., Disp: 14 tablet, Rfl: 0   Medications ordered in this encounter:  Meds ordered this encounter  Medications   nystatin -triamcinolone  ointment (MYCOLOG)    Sig: Apply 1 Application topically 2 (two) times daily.    Dispense:  60 g    Refill:  0    Supervising Provider:   BLAISE ALEENE KIDD [[id number]]   sulfamethoxazole -trimethoprim  (BACTRIM  DS) 800-160 MG tablet    Sig: Take 1 tablet by mouth 2 (two) times daily for 7 days.    Dispense:  14 tablet    Refill:  0    Supervising Provider:   BLAISE ALEENE KIDD [[id number]]   fluconazole  (DIFLUCAN ) 150 MG tablet    Sig: Take 1 tablet (150 mg total) by mouth as directed. Repeat in 3 days as needed    Dispense:  2 tablet    Refill:  0    Supervising Provider:   LAMPTEY, PHILIP O [[id number]]     *If you need refills on other medications prior to your next appointment, please contact your  pharmacy*  Follow-Up: Call back or seek an in-person evaluation if the symptoms worsen or if the condition fails to improve as anticipated.  [facility] [phone]  Other Instructions  -possible yeast/staph infection with eczema flare  -baby shampoo for cleaning  If you have been instructed to have an in-person evaluation today at a local Urgent Care facility, please use the link below. It will take you to a list of all of our available [facility], including address, phone number and hours of operation. Please do not delay care.  [facility]  If you or a family member do not have a primary care provider, use the link below to schedule a visit and establish care. When you choose a [facility] primary care physician or advanced practice provider, you gain a long-term partner in health. Find a Primary Care Provider  Learn more about [facility] in-office and virtual care options: [facility] - Get Care Now

## 2023-10-07 NOTE — Progress Notes (Signed)
 Virtual Visit Consent   Dorothy Nguyen, you are scheduled for a virtual visit with a [facility] provider today. Just as with appointments in the office, your consent must be obtained to participate. Your consent will be active for this visit and any virtual visit you may have with one of our providers in the next 365 days. If you have a MyChart account, a copy of this consent can be sent to you electronically.  As this is a virtual visit, video technology does not allow for your provider to perform a traditional examination. This may limit your provider's ability to fully assess your condition. If your provider identifies any concerns that need to be evaluated in person or the need to arrange testing (such as labs, EKG, etc.), we will make arrangements to do so. Although advances in technology are sophisticated, we cannot ensure that it will always work on either your end or our end. If the connection with a video visit is poor, the visit may have to be switched to a telephone visit. With either a video or telephone visit, we are not always able to ensure that we have a secure connection.  By engaging in this virtual visit, you consent to the provision of healthcare and authorize for your insurance to be billed (if applicable) for the services provided during this visit. Depending on your insurance coverage, you may receive a charge related to this service.  I need to obtain your verbal consent now. Are you willing to proceed with your visit today? Dorothy Nguyen has provided verbal consent on 10/07/2023 for a virtual visit (video or telephone). Chiquita CHRISTELLA Barefoot, NP  Date: 10/07/2023 11:37 AM   Virtual Visit via Video Note   I, Chiquita CHRISTELLA Barefoot, connected with  Dorothy Nguyen  ([id number], 06-20-1995) on 10/07/23 at 11:30 AM EDT by a video-enabled telemedicine application and verified that I am speaking with the correct person using two identifiers.  Location: Patient: Virtual Visit Location  Patient: Home Provider: Virtual Visit Location Provider: Home Office   I discussed the limitations of evaluation and management by telemedicine and the availability of in person appointments. The patient expressed understanding and agreed to proceed.    History of Present Illness: Dorothy Nguyen is a 28 y.o. who identifies as a female who was assigned female at birth, and is being seen today for rash on back of neck and scalp   Onset was 2 days ago, it is leaking yellowish fluid. It burns, itches, and when it dries it hurts for her to move her neck around.  Eczema flare on back of neck and base of hair line. Modifying factors are Aquaphor and vanicream  Denies chest pain, shortness of breath, fevers, chills  Problems:  Patient Active Problem List   Diagnosis Date Noted   Chronic pelvic pain in female 08/30/2014    Allergies:  Allergies  Allergen Reactions   Hydrocodone  Shortness Of Breath   Doxycycline Nausea And Vomiting   Drug [Tape]    Medications:  Current Outpatient Medications:    azithromycin  (ZITHROMAX ) 250 MG tablet, Take 1 tablet (250 mg total) by mouth daily. Take first 2 tablets together, then 1 every day until finished., Disp: 6 tablet, Rfl: 0   cyclobenzaprine  (FLEXERIL ) 10 MG tablet, Take 1 tablet (10 mg total) by mouth at bedtime., Disp: 20 tablet, Rfl: 0   fluticasone  (FLONASE ) 50 MCG/ACT nasal spray, Place 2 sprays into both nostrils daily., Disp: 16 g, Rfl: 0  ibuprofen  (ADVIL ) 600 MG tablet, Take 1 tablet (600 mg total) by mouth every 6 (six) hours as needed., Disp: 30 tablet, Rfl: 0   ipratropium (ATROVENT ) 0.06 % nasal spray, Place 2 sprays into both nostrils 4 (four) times daily., Disp: 15 mL, Rfl: 12   methylPREDNISolone  (MEDROL  DOSEPAK) 4 MG TBPK tablet, Take according to the package insert., Disp: 1 each, Rfl: 0   naproxen  (NAPROSYN ) 500 MG tablet, Take 1 tablet (500 mg total) by mouth 2 (two) times daily., Disp: 20 tablet, Rfl: 0    promethazine -dextromethorphan (PROMETHAZINE -DM) 6.25-15 MG/5ML syrup, Take 5 mLs by mouth 4 (four) times daily as needed., Disp: 118 mL, Rfl: 0  Observations/Objective: Patient is well-developed, well-nourished in no acute distress.  Resting comfortably  at home.  Head is normocephalic, atraumatic.  No labored breathing.  Speech is clear and coherent with logical content.  Patient is alert and oriented at baseline.  See Evisit photos   Assessment and Plan:   1. Bacterial skin infection (Primary)  - nystatin -triamcinolone  ointment (MYCOLOG); Apply 1 Application topically 2 (two) times daily.  Dispense: 60 g; Refill: 0 - sulfamethoxazole -trimethoprim  (BACTRIM  DS) 800-160 MG tablet; Take 1 tablet by mouth 2 (two) times daily for 7 days.  Dispense: 14 tablet; Refill: 0  2. Antibiotic-induced yeast infection  - fluconazole  (DIFLUCAN ) 150 MG tablet; Take 1 tablet (150 mg total) by mouth as directed. Repeat in 3 days as needed  Dispense: 2 tablet; Refill: 0   -possible yeast/staph infection with eczema flare  -baby shampoo for cleaning -strict in person precautions reviewed in detail  Reviewed side effects, risks and benefits of medication.    Patient acknowledged agreement and understanding of the plan.   Past Medical, Surgical, Social History, Allergies, and Medications have been Reviewed.   Follow Up Instructions: I discussed the assessment and treatment plan with the patient. The patient was provided an opportunity to ask questions and all were answered. The patient agreed with the plan and demonstrated an understanding of the instructions.  A copy of instructions were sent to the patient via MyChart unless otherwise noted below.     The patient was advised to call back or seek an in-person evaluation if the symptoms worsen or if the condition fails to improve as anticipated.    Chiquita CHRISTELLA Barefoot, NP

## 2023-10-07 NOTE — Progress Notes (Signed)
 Follow-up question from earlier video visit today with Dorothy Barefoot, NP on our team. Question answered. No charge for EV.

## 2023-12-30 ENCOUNTER — Telehealth: Admitting: Physician Assistant

## 2023-12-30 DIAGNOSIS — T3695XA Adverse effect of unspecified systemic antibiotic, initial encounter: Secondary | ICD-10-CM

## 2023-12-30 DIAGNOSIS — J208 Acute bronchitis due to other specified organisms: Secondary | ICD-10-CM

## 2023-12-30 DIAGNOSIS — H6503 Acute serous otitis media, bilateral: Secondary | ICD-10-CM

## 2023-12-30 DIAGNOSIS — B379 Candidiasis, unspecified: Secondary | ICD-10-CM | POA: Diagnosis not present

## 2023-12-30 DIAGNOSIS — B9689 Other specified bacterial agents as the cause of diseases classified elsewhere: Secondary | ICD-10-CM | POA: Diagnosis not present

## 2023-12-30 MED ORDER — FLUTICASONE PROPIONATE 50 MCG/ACT NA SUSP: 2.0000 | Freq: Every day | NASAL | 0 refills | Status: AC

## 2023-12-30 MED ORDER — PROMETHAZINE-DM 6.25-15 MG/5ML PO SYRP: 5.0000 mL | ORAL_SOLUTION | Freq: Four times a day (QID) | ORAL | 0 refills | Status: AC | PRN

## 2023-12-30 MED ORDER — PREDNISONE 20 MG PO TABS: 40.0000 mg | ORAL_TABLET | Freq: Every day | ORAL | 0 refills | Status: AC

## 2023-12-30 MED ORDER — FLUCONAZOLE 150 MG PO TABS: 150.0000 mg | ORAL_TABLET | ORAL | 0 refills | Status: AC | PRN

## 2023-12-30 MED ORDER — AMOXICILLIN-POT CLAVULANATE 875-125 MG PO TABS: 1.0000 | ORAL_TABLET | Freq: Two times a day (BID) | ORAL | 0 refills | Status: AC

## 2023-12-30 NOTE — Progress Notes (Signed)
 Virtual Visit Consent   Dorothy Nguyen, you are scheduled for a virtual visit with a [facility] provider today. Just as with appointments in the office, your consent must be obtained to participate. Your consent will be active for this visit and any virtual visit you may have with one of our providers in the next 365 days. If you have a MyChart account, a copy of this consent can be sent to you electronically.  As this is a virtual visit, video technology does not allow for your provider to perform a traditional examination. This may limit your provider's ability to fully assess your condition. If your provider identifies any concerns that need to be evaluated in person or the need to arrange testing (such as labs, EKG, etc.), we will make arrangements to do so. Although advances in technology are sophisticated, we cannot ensure that it will always work on either your end or our end. If the connection with a video visit is poor, the visit may have to be switched to a telephone visit. With either a video or telephone visit, we are not always able to ensure that we have a secure connection.  By engaging in this virtual visit, you consent to the provision of healthcare and authorize for your insurance to be billed (if applicable) for the services provided during this visit. Depending on your insurance coverage, you may receive a charge related to this service.  I need to obtain your verbal consent now. Are you willing to proceed with your visit today? Dorothy Nguyen has provided verbal consent on 12/30/2023 for a virtual visit (video or telephone). Delon CHRISTELLA Dickinson, PA-C  Date: 12/30/2023 9:29 AM   Virtual Visit via Video Note   I, Delon CHRISTELLA Dickinson, connected with  Dorothy Nguyen  ([id number], 24-Jul-1995) on 12/30/23 at  9:15 AM EDT by a video-enabled telemedicine application and verified that I am speaking with the correct person using two identifiers.  Location: Patient: Virtual  Visit Location Patient: Home Provider: Virtual Visit Location Provider: Home Office   I discussed the limitations of evaluation and management by telemedicine and the availability of in person appointments. The patient expressed understanding and agreed to proceed.    History of Present Illness: Dorothy Nguyen is a 28 y.o. who identifies as a female who was assigned female at birth, and is being seen today for ear pain and cough.  HPI: URI  This is a new problem. The current episode started 1 to 4 weeks ago (just over a week; worsened Sunday). The problem has been gradually worsening. Maximum temperature: subjective fevers on Friday night, 12/24/23. The fever has been present for Less than 1 day. Associated symptoms include congestion, coughing, ear pain (R>L), headaches, a plugged ear sensation, rhinorrhea (now improved), sinus pain (now improved) and wheezing (clears with coughing). Pertinent negatives include no diarrhea, nausea, sore throat or vomiting. Associated symptoms comments: Body aches. She has tried NSAIDs, antihistamine and increased fluids (Ibuprofen , Mucinex nasal spray) for the symptoms. The treatment provided no relief.     Problems:  Patient Active Problem List   Diagnosis Date Noted   Chronic pelvic pain in female 08/30/2014    Allergies:  Allergies  Allergen Reactions   Hydrocodone  Shortness Of Breath   Doxycycline Nausea And Vomiting   Drug [Tape]    Medications:  Current Outpatient Medications:    amoxicillin -clavulanate (AUGMENTIN ) 875-125 MG tablet, Take 1 tablet by mouth 2 (two) times daily., Disp: 20 tablet, Rfl: 0  fluconazole  (DIFLUCAN ) 150 MG tablet, Take 1 tablet (150 mg total) by mouth every 3 (three) days as needed., Disp: 3 tablet, Rfl: 0   fluticasone  (FLONASE ) 50 MCG/ACT nasal spray, Place 2 sprays into both nostrils daily., Disp: 16 g, Rfl: 0   predniSONE  (DELTASONE ) 20 MG tablet, Take 2 tablets (40 mg total) by mouth daily with breakfast., Disp:  10 tablet, Rfl: 0   promethazine -dextromethorphan (PROMETHAZINE -DM) 6.25-15 MG/5ML syrup, Take 5 mLs by mouth 4 (four) times daily as needed., Disp: 118 mL, Rfl: 0   nystatin -triamcinolone  ointment (MYCOLOG), Apply 1 Application topically 2 (two) times daily., Disp: 60 g, Rfl: 0  Observations/Objective: Patient is well-developed, well-nourished in no acute distress.  Resting comfortably at home.  Head is normocephalic, atraumatic.  No labored breathing.  Speech is clear and coherent with logical content.  Patient is alert and oriented at baseline.    Assessment and Plan: 1. Non-recurrent acute serous otitis media of both ears (Primary) - amoxicillin -clavulanate (AUGMENTIN ) 875-125 MG tablet; Take 1 tablet by mouth 2 (two) times daily.  Dispense: 20 tablet; Refill: 0 - fluticasone  (FLONASE ) 50 MCG/ACT nasal spray; Place 2 sprays into both nostrils daily.  Dispense: 16 g; Refill: 0  2. Acute bacterial bronchitis - predniSONE  (DELTASONE ) 20 MG tablet; Take 2 tablets (40 mg total) by mouth daily with breakfast.  Dispense: 10 tablet; Refill: 0 - promethazine -dextromethorphan (PROMETHAZINE -DM) 6.25-15 MG/5ML syrup; Take 5 mLs by mouth 4 (four) times daily as needed.  Dispense: 118 mL; Refill: 0  3. Antibiotic-induced yeast infection - fluconazole  (DIFLUCAN ) 150 MG tablet; Take 1 tablet (150 mg total) by mouth every 3 (three) days as needed.  Dispense: 3 tablet; Refill: 0  - Worsening symptoms that have not responded to OTC medications.  - Will give Augmentin  and Prednisone  - Promethazine  DM for cough - Continue saline nasal rinses - Add Flonase  (Fluticasone ) nasal spray over the counter for possible eustachian tube dysfunction - Steam and humidifier can help - Warm compress to ear - Stay well hydrated and get plenty of rest.  - Seek in person evaluation if no symptom improvement or if symptoms worsen   Follow Up Instructions: I discussed the assessment and treatment plan with the  patient. The patient was provided an opportunity to ask questions and all were answered. The patient agreed with the plan and demonstrated an understanding of the instructions.  A copy of instructions were sent to the patient via MyChart unless otherwise noted below.    The patient was advised to call back or seek an in-person evaluation if the symptoms worsen or if the condition fails to improve as anticipated.    Delon CHRISTELLA Dickinson, PA-C

## 2023-12-30 NOTE — Patient Instructions (Signed)
 Dorothy Nguyen, thank you for joining Delon CHRISTELLA Dickinson, PA-C for today's virtual visit.  While this provider is not your primary care provider (PCP), if your PCP is located in our provider database this encounter information will be shared with them immediately following your visit.   A [facility] MyChart account gives you access to today's visit and all your visits, tests, and labs performed at [facility]  click here if you don't have a [facility] MyChart account or go to [email or web address]  Consent: (Patient) Dorothy Nguyen provided verbal consent for this virtual visit at the beginning of the encounter.  Current Medications:  Current Outpatient Medications:    amoxicillin -clavulanate (AUGMENTIN ) 875-125 MG tablet, Take 1 tablet by mouth 2 (two) times daily., Disp: 20 tablet, Rfl: 0   fluconazole  (DIFLUCAN ) 150 MG tablet, Take 1 tablet (150 mg total) by mouth every 3 (three) days as needed., Disp: 3 tablet, Rfl: 0   fluticasone  (FLONASE ) 50 MCG/ACT nasal spray, Place 2 sprays into both nostrils daily., Disp: 16 g, Rfl: 0   predniSONE  (DELTASONE ) 20 MG tablet, Take 2 tablets (40 mg total) by mouth daily with breakfast., Disp: 10 tablet, Rfl: 0   promethazine -dextromethorphan (PROMETHAZINE -DM) 6.25-15 MG/5ML syrup, Take 5 mLs by mouth 4 (four) times daily as needed., Disp: 118 mL, Rfl: 0   nystatin -triamcinolone  ointment (MYCOLOG), Apply 1 Application topically 2 (two) times daily., Disp: 60 g, Rfl: 0   Medications ordered in this encounter:  Meds ordered this encounter  Medications   amoxicillin -clavulanate (AUGMENTIN ) 875-125 MG tablet    Sig: Take 1 tablet by mouth 2 (two) times daily.    Dispense:  20 tablet    Refill:  0    Supervising Provider:   LAMPTEY, PHILIP O [[id number]]   predniSONE  (DELTASONE ) 20 MG tablet    Sig: Take 2 tablets (40 mg total) by mouth daily with breakfast.    Dispense:  10 tablet    Refill:  0    Supervising Provider:    LAMPTEY, PHILIP O [[id number]]   fluticasone  (FLONASE ) 50 MCG/ACT nasal spray    Sig: Place 2 sprays into both nostrils daily.    Dispense:  16 g    Refill:  0    Supervising Provider:   LAMPTEY, PHILIP O [[id number]]   promethazine -dextromethorphan (PROMETHAZINE -DM) 6.25-15 MG/5ML syrup    Sig: Take 5 mLs by mouth 4 (four) times daily as needed.    Dispense:  118 mL    Refill:  0    Supervising Provider:   BLAISE ALEENE KIDD [[id number]]   fluconazole  (DIFLUCAN ) 150 MG tablet    Sig: Take 1 tablet (150 mg total) by mouth every 3 (three) days as needed.    Dispense:  3 tablet    Refill:  0    Supervising Provider:   LAMPTEY, PHILIP O [[id number]]     *If you need refills on other medications prior to your next appointment, please contact your pharmacy*  Follow-Up: Call back or seek an in-person evaluation if the symptoms worsen or if the condition fails to improve as anticipated.  [facility] [phone]  Other Instructions Otitis Media, Adult  Otitis media occurs when there is inflammation and fluid in the middle ear with signs and symptoms of an acute infection. The middle ear is a part of the ear that contains bones for hearing as well as air that helps send sounds to the brain. When infected fluid builds up in this space, it causes  pressure and can lead to an ear infection. The eustachian tube connects the middle ear to the back of the nose (nasopharynx) and normally allows air into the middle ear. If the eustachian tube becomes blocked, fluid can build up and become infected. What are the causes? This condition is caused by a blockage in the eustachian tube. This can be caused by mucus or by swelling of the tube. Problems that can cause a blockage include: A cold or other upper respiratory infection. Allergies. An irritant, such as tobacco smoke. Enlarged adenoids. The adenoids are areas of soft tissue located high in the back of the throat, behind the nose and the roof of  the mouth. They are part of the body's defense system (immune system). A mass in the nasopharynx. Damage to the ear caused by pressure changes (barotrauma). What increases the risk? You are more likely to develop this condition if you: Smoke or are exposed to tobacco smoke. Have an opening in the roof of your mouth (cleft palate). Have gastroesophageal reflux. Have an immune system disorder. What are the signs or symptoms? Symptoms of this condition include: Ear pain. Fever. Decreased hearing. Tiredness (lethargy). Fluid leaking from the ear, if the eardrum is ruptured or has burst. Ringing in the ear. How is this diagnosed?  This condition is diagnosed with a physical exam. During the exam, your health care provider will use an instrument called an otoscope to look in your ear and check for redness, swelling, and fluid. He or she will also ask about your symptoms. Your health care provider may also order tests, such as: A pneumatic otoscopy. This is a test to check the movement of the eardrum. It is done by squeezing a small amount of air into the ear. A tympanogram. This is a test that shows how well the eardrum moves in response to air pressure in the ear canal. It provides a graph for your health care provider to review. How is this treated? This condition can go away on its own within 3-5 days. But if the condition is caused by a bacterial infection and does not go away on its own, or if it keeps coming back, your health care provider may: Prescribe antibiotic medicine to treat the infection. Prescribe or recommend medicines to control pain. Follow these instructions at home: Take over-the-counter and prescription medicines only as told by your health care provider. If you were prescribed an antibiotic medicine, take it as told by your health care provider. Do not stop taking the antibiotic even if you start to feel better. Keep all follow-up visits. This is important. Contact a  health care provider if: You have bleeding from your nose. There is a lump on your neck. You are not feeling better in 5 days. You feel worse instead of better. Get help right away if: You have severe pain that is not controlled with medicine. You have swelling, redness, or pain around your ear. You have stiffness in your neck. A part of your face is not moving (paralyzed). The bone behind your ear (mastoid bone) is tender when you touch it. You develop a severe headache. Summary Otitis media is redness, soreness, and swelling of the middle ear, usually resulting in pain and decreased hearing. This condition can go away on its own within 3-5 days. If the problem does not go away in 3-5 days, your health care provider may give you medicines to treat the infection. If you were prescribed an antibiotic medicine, take it as  told by your health care provider. Follow all instructions that were given to you by your health care provider. This information is not intended to replace advice given to you by your health care provider. Make sure you discuss any questions you have with your health care provider. Document Revised: 06/03/2020 Document Reviewed: 06/03/2020 Elsevier Patient Education  2024 [employer]   If you have been instructed to have an in-person evaluation today at a local Urgent Care facility, please use the link below. It will take you to a list of all of our available [facility], including address, phone number and hours of operation. Please do not delay care.  [facility]  If you or a family member do not have a primary care provider, use the link below to schedule a visit and establish care. When you choose a [facility] primary care physician or advanced practice provider, you gain a long-term partner in health. Find a Primary Care Provider  Learn more about [facility]'s in-office and virtual care options: [facility] - Get Care Now
